# Patient Record
Sex: Female | Born: 1970
Health system: Southern US, Community
[De-identification: ages and names within clinical notes are randomized; demographics above are authoritative.]

## PROBLEM LIST (undated history)

## (undated) ENCOUNTER — Emergency Department
Disposition: A | Discharge status: Home / Self Care | Payer: Self-pay | Attending: Emergency Medicine | Admitting: Emergency Medicine

## (undated) ENCOUNTER — Emergency Department (HOSPITAL_BASED_OUTPATIENT_CLINIC_OR_DEPARTMENT_OTHER): Payer: BC Managed Care – PPO | Source: Home / Self Care

## (undated) DIAGNOSIS — R51 Headache: Secondary | ICD-10-CM

## (undated) DIAGNOSIS — R519 Headache, unspecified: Secondary | ICD-10-CM

## (undated) DIAGNOSIS — Z8619 Personal history of other infectious and parasitic diseases: Secondary | ICD-10-CM

## (undated) DIAGNOSIS — D242 Benign neoplasm of left breast: Secondary | ICD-10-CM

## (undated) HISTORY — DX: Personal history of other infectious and parasitic diseases: Z86.19

## (undated) HISTORY — PX: DILATION AND CURETTAGE OF UTERUS: SHX78

## (undated) HISTORY — PX: ROTATOR CUFF REPAIR: SHX139

---

## 2002-04-06 ENCOUNTER — Other Ambulatory Visit: Admission: RE | Admit: 2002-04-06 | Discharge: 2002-04-06 | Payer: Self-pay | Admitting: Gynecology

## 2002-11-11 ENCOUNTER — Inpatient Hospital Stay (HOSPITAL_COMMUNITY): Admission: AD | Admit: 2002-11-11 | Discharge: 2002-11-14 | Payer: Self-pay | Admitting: Gynecology

## 2002-12-21 ENCOUNTER — Other Ambulatory Visit: Admission: RE | Admit: 2002-12-21 | Discharge: 2002-12-21 | Payer: Self-pay | Admitting: Gynecology

## 2004-01-10 ENCOUNTER — Other Ambulatory Visit: Admission: RE | Admit: 2004-01-10 | Discharge: 2004-01-10 | Payer: Self-pay | Admitting: Gynecology

## 2004-02-08 ENCOUNTER — Ambulatory Visit (HOSPITAL_COMMUNITY): Admission: RE | Admit: 2004-02-08 | Discharge: 2004-02-08 | Payer: Self-pay | Admitting: Gynecology

## 2004-03-10 ENCOUNTER — Ambulatory Visit: Payer: Self-pay | Admitting: Family Medicine

## 2004-12-28 ENCOUNTER — Other Ambulatory Visit: Admission: RE | Admit: 2004-12-28 | Discharge: 2004-12-28 | Payer: Self-pay | Admitting: Gynecology

## 2005-01-25 ENCOUNTER — Ambulatory Visit (HOSPITAL_COMMUNITY): Admission: RE | Admit: 2005-01-25 | Discharge: 2005-01-25 | Payer: Self-pay | Admitting: Gynecology

## 2005-01-25 ENCOUNTER — Encounter (INDEPENDENT_AMBULATORY_CARE_PROVIDER_SITE_OTHER): Payer: Self-pay | Admitting: *Deleted

## 2005-04-13 ENCOUNTER — Ambulatory Visit (HOSPITAL_COMMUNITY): Admission: RE | Admit: 2005-04-13 | Discharge: 2005-04-13 | Payer: Self-pay | Admitting: Gynecology

## 2006-04-24 ENCOUNTER — Ambulatory Visit (HOSPITAL_COMMUNITY): Admission: RE | Admit: 2006-04-24 | Discharge: 2006-04-24 | Payer: Self-pay | Admitting: Obstetrics & Gynecology

## 2007-04-28 ENCOUNTER — Ambulatory Visit (HOSPITAL_COMMUNITY): Admission: RE | Admit: 2007-04-28 | Discharge: 2007-04-28 | Payer: Self-pay | Admitting: Obstetrics & Gynecology

## 2008-04-29 ENCOUNTER — Ambulatory Visit (HOSPITAL_COMMUNITY): Admission: RE | Admit: 2008-04-29 | Discharge: 2008-04-29 | Payer: Self-pay | Admitting: Obstetrics & Gynecology

## 2009-05-02 ENCOUNTER — Ambulatory Visit (HOSPITAL_COMMUNITY): Admission: RE | Admit: 2009-05-02 | Discharge: 2009-05-02 | Payer: Self-pay | Admitting: Obstetrics & Gynecology

## 2010-05-04 ENCOUNTER — Ambulatory Visit (HOSPITAL_COMMUNITY)
Admission: RE | Admit: 2010-05-04 | Discharge: 2010-05-04 | Payer: Self-pay | Source: Home / Self Care | Attending: Obstetrics & Gynecology | Admitting: Obstetrics & Gynecology

## 2010-08-25 NOTE — H&P (Signed)
   NAMELAVETTE, Tanya Warren                             ACCOUNT NO.:  [id number]   MEDICAL RECORD NO.:  [id number]                   PATIENT TYPE:  MAT   LOCATION:  MATC                                 FACILITY:  [facility]   PHYSICIAN:  Ivor Costa. Farrel Gobble, M.D.              DATE OF BIRTH:  1970-12-10   DATE OF ADMISSION:  11/11/2002  DATE OF DISCHARGE:                                HISTORY & PHYSICAL   CHIEF COMPLAINT:  40-1/2 weeks, favorable cervix.   HISTORY OF PRESENT ILLNESS:  The patient is a 41 year old G2, P1 with an  estimated   Dictation cut off at this point                                               [employer]. Farrel Gobble, M.D.    THL/MEDQ  D:  11/11/2002  T:  11/11/2002  Job:  [id number]

## 2010-08-25 NOTE — Op Note (Signed)
Tanya Warren, Tanya Warren                   ACCOUNT NO.:  [id number]   MEDICAL RECORD NO.:  [id number]          PATIENT TYPE:  AMB   LOCATION:  SDC                           FACILITY:  [facility]   PHYSICIAN:  Ivor Costa. Farrel Gobble, M.D. DATE OF BIRTH:  03/04/1971   DATE OF PROCEDURE:  01/25/2005  DATE OF DISCHARGE:                                 OPERATIVE REPORT   PREOPERATIVE DIAGNOSIS:  Missed abortion.   POSTOPERATIVE DIAGNOSIS:  Missed abortion.   PROCEDURE:  First trimester dilatation and curettage.   SURGEON:  Ivor Costa. Farrel Gobble, M.D.   ANESTHESIA:  IV sedation with a paracervical block.   ESTIMATED BLOOD LOSS:  Minimal.   FINDINGS:  The uterus was retroverted, sounding 9 cm.  The cervix was  grossly dilated.   PROCEDURE:  The patient was taken to the operating room and IV sedation was  induced, placed in the dorsal lithotomy position, prepped and draped in the  usual sterile fashion.  Bimanual exam was performed.  The orientation of the  uterus was confirmed.  A bivalve speculum was then placed in the vagina.  The cervix was visualized, stabilized with a single-tooth tenaculum.  The  cervix appeared to be grossly dilated.  A paracervical block of equal parts  of 2% lidocaine with epinephrine and 0.5% Marcaine for a total of 20 mL was  placed circumferentially around the cervix.  The patient was noted to be  comfortable.  The uterine sound was placed for orientation of the canal.  The cervix was noted to be grossly dilated.  A 23 French dilator was  advanced through the cervix without any difficulty, as was a 25.  Based on  that, a #7 suction curette was then advanced through the cervix.  Suction  was obtained.  The cavity was cleared of its contents.  Sharp curettage  followed again by suction was then performed until the cavity was felt to be  empty.  The uterine walls felt smooth and there was a cry noted throughout.  The patient tolerated the procedure well.  Sponge, lap and needle  counts  were correct x2.  She was given Methergine intraoperatively.      Ivor Costa. Farrel Gobble, M.D.  Electronically Signed     THL/MEDQ  D:  01/25/2005  T:  01/25/2005  Job:  [id number]

## 2010-08-25 NOTE — Discharge Summary (Signed)
   Tanya Warren, Tanya Warren                             ACCOUNT NO.:  [id number]   MEDICAL RECORD NO.:  [id number]                   PATIENT TYPE:  INP   LOCATION:  9134                                 FACILITY:  [facility]   PHYSICIAN:  Juan H. Lily Peer, M.D.             DATE OF BIRTH:  Feb 02, 1971   DATE OF ADMISSION:  11/11/2002  DATE OF DISCHARGE:  11/14/2002                                 DISCHARGE SUMMARY   DISCHARGE DIAGNOSES:  1. Intrauterine pregnancy 40+ weeks, delivered.  2. Positive group B Strep.  3. Status post spontaneous vaginal delivery.   HISTORY:  A 40 year old female gravida 2, para 0, aborta 1 with an EDC of  November 10, 2002.  Prenatal course has been complicated only by positive group  B Strep.   HOSPITAL COURSE:  On November 11, 2002 patient presented at 40+ weeks for  decreased fetal movement.  She had initially been scheduled for induction on  the following day but on admission she was found to actually be contracting  every two to three minutes with a reassuring fetal heart rate tracing.  The  patient was begun on clindamycin for protocol.  Cervix was 1, 50%, -1.  Labor was augmented.  Subsequently, on November 12, 2002 patient underwent a  spontaneous vaginal delivery of a female, Apgars 8/9, weight 7 pounds 12  ounces.  Noted to be a midline episiotomy.  There was dark, thin meconium.  DeLee suctioned prior to delivery.  Pediatrics in attendance.  There were no  complications.  Postpartum patient remained afebrile, voiding, stable  condition.  She was discharged to home on November 14, 2002 and given  [facility] Gynecology postpartum instructions/postpartum booklet.   ACCESSORY CLINICAL FINDINGS:  Laboratories:  The patient is O+.  Rubella  immune.  On November 13, 2002 hemoglobin 11.3.   DISPOSITION:  The patient is discharged to home.  Informed to return to  office six weeks.  If any problem prior to that time to be seen in office.     Susa Loffler, P.A.                     Juan H. Lily Peer, M.D.    TSG/MEDQ  D:  12/04/2002  T:  12/04/2002  Job:  [id number]

## 2011-02-16 ENCOUNTER — Emergency Department
Admission: EM | Admit: 2011-02-16 | Discharge: 2011-02-16 | Disposition: A | Discharge status: Home / Self Care | Payer: BC Managed Care – PPO | Source: Home / Self Care | Attending: Family Medicine | Admitting: Family Medicine

## 2011-02-16 ENCOUNTER — Encounter: Payer: Self-pay | Admitting: *Deleted

## 2011-02-16 DIAGNOSIS — J209 Acute bronchitis, unspecified: Secondary | ICD-10-CM

## 2011-02-16 MED ORDER — BENZONATATE 200 MG PO CAPS: 200.0000 mg | ORAL_CAPSULE | Freq: Every day | ORAL | Status: AC

## 2011-02-16 MED ORDER — CLARITHROMYCIN 500 MG PO TABS: 500.0000 mg | ORAL_TABLET | Freq: Two times a day (BID) | ORAL | Status: AC

## 2011-02-16 NOTE — ED Notes (Signed)
Patient c/o dry cough that lasted 2 weeks. Cough resolved for a week and has returned 3 days ago.

## 2011-02-16 NOTE — ED Provider Notes (Signed)
History     CSN: [id number] Arrival date & time: 02/16/2011 10:49 AM   First MD Initiated Contact with Patient 02/16/11 1106      Chief Complaint  Patient presents with  . Cough      HPI Comments: Patient notes that she developed a cold-like illness about 4 weeks ago that mostly resolved after two weeks, but mild cough persisted.  She has a history of seasonal allergies in autumn.  The cough is now becoming worse. She notes that she had a Tdap in August, 2012  Patient is a 40 y.o. female presenting with cough. The history is provided by the patient.  Cough This is a recurrent problem. The current episode started more than 1 week ago. The problem occurs hourly. The problem has been gradually worsening. The cough is non-productive. There has been no fever. Pertinent negatives include no chest pain, no chills, no sweats, no weight loss, no ear congestion, no ear pain, no headaches, no rhinorrhea, no sore throat, no myalgias, no shortness of breath and no wheezing. She has tried nothing for the symptoms. She is not a smoker. Her past medical history is significant for pneumonia. Past medical history comments: History of pneumonia about 15 years ago.    History reviewed. No pertinent past medical history.  History reviewed. No pertinent past surgical history.  History reviewed. No pertinent family history.  History  Substance Use Topics  . Smoking status: Never Smoker   . Smokeless tobacco: Not on file  . Alcohol Use: Yes    OB History    Grav Para Term Preterm Abortions TAB SAB Ect Mult Living                  Review of Systems  Constitutional: Negative for chills and weight loss.  HENT: Negative for ear pain, sore throat and rhinorrhea.   Eyes: Negative.   Respiratory: Positive for cough and chest tightness. Negative for shortness of breath and wheezing.   Cardiovascular: Negative.  Negative for chest pain.  Gastrointestinal: Negative.   Genitourinary: Negative.     Musculoskeletal: Negative.  Negative for myalgias.  Neurological: Negative for headaches.    Allergies  Cinnamon; Neosporin lt; and Penicillins  Home Medications   Current Outpatient Rx  Name Route Sig Dispense Refill  . BENZONATATE 200 MG PO CAPS Oral Take 1 capsule (200 mg total) by mouth at bedtime. 12 capsule 0  . CLARITHROMYCIN 500 MG PO TABS Oral Take 1 tablet (500 mg total) by mouth 2 (two) times daily. 14 tablet 0    BP 148/98  Pulse 92  Temp(Src) 98.3 F (36.8 C) (Oral)  Resp 16  Ht 5\' 8"  (1.727 m)  Wt 150 lb (68.04 kg)  BMI 22.81 kg/m2  SpO2 100%  Physical Exam  Nursing note and vitals reviewed. Constitutional: She is oriented to person, place, and time. She appears well-developed and well-nourished. No distress.  HENT:  Head: Normocephalic and atraumatic.  Right Ear: Tympanic membrane normal.  Left Ear: Tympanic membrane normal.  Nose: Nose normal.  Mouth/Throat: Oropharynx is clear and moist. No oropharyngeal exudate.  Eyes: Right eye exhibits no discharge. Left eye exhibits no discharge. No scleral icterus.  Neck: Neck supple.  Cardiovascular: Normal rate, regular rhythm and normal heart sounds.   Pulmonary/Chest: No respiratory distress. She has no wheezes. She has no rhonchi. She has no rales.  Lymphadenopathy:    She has no cervical adenopathy.  Neurological: She is alert and oriented to person, place, and  time.  Skin: Skin is warm and dry.    ED Course  Procedures:  None      1. Acute bronchitis       MDM  Begin Biaxin.  Begin expectorant, topical decongestant if needed, saline nasal spray, cough suppressant at bedtime.  Increase fluid intake, rest.  Follow-up with PCP if not improving. Follow-up with PCP if BP remains elevated         Donna Christen, MD 02/16/11 2012

## 2011-02-18 ENCOUNTER — Telehealth: Payer: Self-pay | Admitting: Emergency Medicine

## 2011-02-18 MED ORDER — HYDROCOD POLST-CHLORPHEN POLST 10-8 MG/5ML PO LQCR
5.0000 mL | Freq: Every evening | ORAL | Status: AC
Start: 2011-02-18 — End: 2011-02-18

## 2011-04-12 ENCOUNTER — Other Ambulatory Visit (HOSPITAL_COMMUNITY): Payer: Self-pay | Admitting: Obstetrics & Gynecology

## 2011-04-12 DIAGNOSIS — Z1231 Encounter for screening mammogram for malignant neoplasm of breast: Secondary | ICD-10-CM

## 2011-06-05 ENCOUNTER — Ambulatory Visit (HOSPITAL_COMMUNITY): Payer: BC Managed Care – PPO

## 2011-06-22 ENCOUNTER — Ambulatory Visit (HOSPITAL_COMMUNITY)
Admission: RE | Admit: 2011-06-22 | Discharge: 2011-06-22 | Disposition: A | Discharge status: Home / Self Care | Payer: BC Managed Care – PPO | Source: Ambulatory Visit | Attending: Obstetrics & Gynecology | Admitting: Obstetrics & Gynecology

## 2011-06-22 DIAGNOSIS — Z1231 Encounter for screening mammogram for malignant neoplasm of breast: Secondary | ICD-10-CM | POA: Insufficient documentation

## 2011-10-09 ENCOUNTER — Encounter (HOSPITAL_BASED_OUTPATIENT_CLINIC_OR_DEPARTMENT_OTHER): Payer: Self-pay

## 2011-10-09 ENCOUNTER — Emergency Department (HOSPITAL_BASED_OUTPATIENT_CLINIC_OR_DEPARTMENT_OTHER)
Admission: EM | Admit: 2011-10-09 | Discharge: 2011-10-09 | Disposition: A | Discharge status: Home / Self Care | Payer: Worker's Compensation | Attending: Emergency Medicine | Admitting: Emergency Medicine

## 2011-10-09 DIAGNOSIS — Y939 Activity, unspecified: Secondary | ICD-10-CM | POA: Insufficient documentation

## 2011-10-09 DIAGNOSIS — Y999 Unspecified external cause status: Secondary | ICD-10-CM | POA: Insufficient documentation

## 2011-10-09 DIAGNOSIS — S8010XA Contusion of unspecified lower leg, initial encounter: Secondary | ICD-10-CM | POA: Insufficient documentation

## 2011-10-09 DIAGNOSIS — W540XXA Bitten by dog, initial encounter: Secondary | ICD-10-CM | POA: Insufficient documentation

## 2011-10-09 DIAGNOSIS — Z203 Contact with and (suspected) exposure to rabies: Secondary | ICD-10-CM | POA: Insufficient documentation

## 2011-10-09 DIAGNOSIS — S81859A Open bite, unspecified lower leg, initial encounter: Secondary | ICD-10-CM

## 2011-10-09 DIAGNOSIS — Y929 Unspecified place or not applicable: Secondary | ICD-10-CM | POA: Insufficient documentation

## 2011-10-09 MED ORDER — RABIES VACCINE, PCEC IM SUSR
INTRAMUSCULAR | Status: AC
  Administered 2011-10-09: 1 mL via INTRAMUSCULAR
  Filled 2011-10-09: qty 1

## 2011-10-09 MED ORDER — RABIES VACCINE, PCEC IM SUSR
1.0000 mL | Freq: Once | INTRAMUSCULAR | Status: AC
  Administered 2011-10-09: 1 mL via INTRAMUSCULAR

## 2011-10-09 MED ORDER — RABIES IMMUNE GLOBULIN 150 UNIT/ML IM INJ
20.0000 [IU]/kg | INJECTION | Freq: Once | INTRAMUSCULAR | Status: AC
  Administered 2011-10-09: 1425 [IU] via INTRAMUSCULAR
  Filled 2011-10-09: qty 9.5

## 2011-10-09 MED ORDER — RABIES IMMUNE GLOBULIN 150 UNIT/ML IM INJ
INJECTION | INTRAMUSCULAR | Status: AC
  Administered 2011-10-09: 1425 [IU] via INTRAMUSCULAR
  Filled 2011-10-09: qty 10

## 2011-10-09 NOTE — ED Notes (Signed)
Rabies schedule completed and faxed to RN @ [facility] for pt, also faxed to [facility] and pharmacy x 2.

## 2011-10-09 NOTE — ED Notes (Signed)
Dog bite to left LE 6/26-dog rabies status is unclear-pt seen by med "[facility]" x 2-on abx at this time-seen at [facility] today

## 2011-10-09 NOTE — ED Provider Notes (Signed)
History     CSN: [id number]  Arrival date & time 10/09/11  1457   First MD Initiated Contact with Patient 10/09/11 1606      Chief Complaint  Patient presents with  . Animal Bite    (Consider location/radiation/quality/duration/timing/severity/associated sxs/prior treatment) Patient is a 41 y.o. female presenting with animal bite. The history is provided by the patient. No language interpreter was used.  Animal Bite  Episode onset: 6/26. The incident occurred in the street. There is an injury to the left lower leg. The patient is experiencing no pain.  Pt was bitten by a dog.  Pt here for rabies shots.  History reviewed. No pertinent past medical history.  Past Surgical History  Procedure Date  . Dilation and curettage of uterus     No family history on file.  History  Substance Use Topics  . Smoking status: Never Smoker   . Smokeless tobacco: Not on file  . Alcohol Use: Yes    OB History    Grav Para Term Preterm Abortions TAB SAB Ect Mult Living                  Review of Systems  Musculoskeletal: Positive for joint swelling.  Skin: Positive for wound.  All other systems reviewed and are negative.    Allergies  Allantoin-pramoxine; Cinnamon; Penicillins; and Neosporin  Home Medications   Current Outpatient Rx  Name Route Sig Dispense Refill  . IBUPROFEN 600 MG PO TABS Oral Take 600 mg by mouth every 6 (six) hours as needed. Patient used this medication for pain.    Marland Kitchen METRONIDAZOLE 500 MG PO TABS Oral Take 500 mg by mouth 3 (three) times daily.    . SULFAMETHOXAZOLE-TMP DS 800-160 MG PO TABS Oral Take 1 tablet by mouth every 12 (twelve) hours.      BP 144/92  Pulse 92  Temp 98.6 F (37 C) (Oral)  Resp 16  Ht 5\' 8"  (1.727 m)  Wt 158 lb (71.668 kg)  BMI 24.02 kg/m2  SpO2 100%  LMP 09/11/2011  Physical Exam  Nursing note and vitals reviewed. Constitutional: She is oriented to person, place, and time. She appears well-developed and well-nourished.   HENT:  Head: Normocephalic.  Musculoskeletal: She exhibits tenderness.  Neurological: She is alert and oriented to person, place, and time. She has normal reflexes.  Skin: There is erythema.  Psychiatric: She has a normal mood and affect.       Bruised area, abrasions left lower leg,      ED Course  Procedures (including critical care time)  Labs Reviewed - No data to display No results found.   1. Dog Bite Of Lower Leg       MDM  Pt given rabies vaccine and rabies immune globulin.   Pt advised of follow up        Elson Areas, [place] 10/09/11 1641  Lonia Skinner [place], [place] 10/09/11 1642

## 2011-10-12 ENCOUNTER — Emergency Department (INDEPENDENT_AMBULATORY_CARE_PROVIDER_SITE_OTHER)
Admission: EM | Admit: 2011-10-12 | Discharge: 2011-10-12 | Disposition: A | Discharge status: Home / Self Care | Payer: Worker's Compensation | Source: Home / Self Care

## 2011-10-12 ENCOUNTER — Encounter: Payer: Self-pay | Admitting: *Deleted

## 2011-10-12 DIAGNOSIS — Z23 Encounter for immunization: Secondary | ICD-10-CM

## 2011-10-12 MED ORDER — RABIES VACCINE, PCEC IM SUSR
1.0000 mL | Freq: Once | INTRAMUSCULAR | Status: AC
  Administered 2011-10-12: 1 mL via INTRAMUSCULAR

## 2011-10-12 NOTE — ED Notes (Signed)
Pt came in for second rabies shot

## 2011-10-13 NOTE — ED Provider Notes (Signed)
Medical screening examination/treatment/procedure(s) were performed by non-physician practitioner and as supervising physician I was immediately available for consultation/collaboration.  [place] Jaquith M [place] Dodgen, MD 10/13/11 1254 

## 2012-06-04 ENCOUNTER — Other Ambulatory Visit (HOSPITAL_COMMUNITY): Payer: Self-pay | Admitting: Obstetrics & Gynecology

## 2012-06-04 DIAGNOSIS — Z1231 Encounter for screening mammogram for malignant neoplasm of breast: Secondary | ICD-10-CM

## 2012-06-23 ENCOUNTER — Ambulatory Visit (HOSPITAL_COMMUNITY)
Admission: RE | Admit: 2012-06-23 | Discharge: 2012-06-23 | Disposition: A | Discharge status: Home / Self Care | Payer: BC Managed Care – PPO | Source: Ambulatory Visit | Attending: Obstetrics & Gynecology | Admitting: Obstetrics & Gynecology

## 2012-06-23 DIAGNOSIS — Z1231 Encounter for screening mammogram for malignant neoplasm of breast: Secondary | ICD-10-CM | POA: Insufficient documentation

## 2012-09-02 ENCOUNTER — Encounter: Payer: Self-pay | Admitting: *Deleted

## 2012-09-02 ENCOUNTER — Emergency Department
Admission: EM | Admit: 2012-09-02 | Discharge: 2012-09-02 | Disposition: A | Discharge status: Home / Self Care | Payer: BC Managed Care – PPO | Source: Home / Self Care | Attending: Family Medicine | Admitting: Family Medicine

## 2012-09-02 DIAGNOSIS — J020 Streptococcal pharyngitis: Secondary | ICD-10-CM

## 2012-09-02 DIAGNOSIS — J029 Acute pharyngitis, unspecified: Secondary | ICD-10-CM

## 2012-09-02 LAB — POCT RAPID STREP A (OFFICE): Rapid Strep A Screen: POSITIVE — AB

## 2012-09-02 MED ORDER — AZITHROMYCIN 250 MG PO TABS: ORAL_TABLET | ORAL | Status: DC

## 2012-09-02 NOTE — ED Notes (Signed)
Pt c/o sore throat, chills and body aches x 1 day. Denies fever. She has taken Advil.

## 2012-09-02 NOTE — ED Provider Notes (Signed)
History     CSN: [id number]  Arrival date & time 09/02/12  1308   First MD Initiated Contact with Patient 09/02/12 [phone]      Chief Complaint  Patient presents with  . Sore Throat  . Generalized Body Aches   HPI  SORE THROAT  Onset: 1 day  Description: sore throat, fever  Modifying factors: none  Symptoms  Fever:  yes URI symptoms: no Cough: no Headache: no Rash:  no Swollen glands:   yes Recent Strep Exposure: unsure  LUQ pain: no Heartburn/brash: no Allergy Symptoms: no  Red Flags STD exposure: no Breathing difficulty: no Drooling: no Trismus: no   History reviewed. No pertinent past medical history.  Past Surgical History  Procedure Laterality Date  . Dilation and curettage of uterus      History reviewed. No pertinent family history.  History  Substance Use Topics  . Smoking status: Never Smoker   . Smokeless tobacco: Not on file  . Alcohol Use: Yes    OB History   Grav Para Term Preterm Abortions TAB SAB Ect Mult Living                  Review of Systems  All other systems reviewed and are negative.    Allergies  Allantoin-pramoxine; Cinnamon; Penicillins; and Neosporin  Home Medications   Current Outpatient Rx  Name  Route  Sig  Dispense  Refill  . ibuprofen (ADVIL,MOTRIN) 600 MG tablet   Oral   Take 600 mg by mouth every 6 (six) hours as needed. Patient used this medication for pain.         . metroNIDAZOLE (FLAGYL) 500 MG tablet   Oral   Take 500 mg by mouth 3 (three) times daily.         Marland Kitchen sulfamethoxazole-trimethoprim (BACTRIM DS) 800-160 MG per tablet   Oral   Take 1 tablet by mouth every 12 (twelve) hours.           There were no vitals taken for this visit.  Physical Exam  Constitutional: She appears well-developed and well-nourished.  HENT:  Head: Normocephalic and atraumatic.  Right Ear: External ear normal.  Left Ear: External ear normal.  Mouth/Throat: Oropharyngeal exudate present.  Eyes:  Conjunctivae are normal. Pupils are equal, round, and reactive to light.  Neck: Normal range of motion. Neck supple.  Cardiovascular: Normal rate, regular rhythm and normal heart sounds.   Pulmonary/Chest: Effort normal.  Abdominal: Soft.  Musculoskeletal: Normal range of motion.  Lymphadenopathy:    She has cervical adenopathy.  Neurological: She is alert.  Skin: Skin is warm.    ED Course  Procedures (including critical care time)  Labs Reviewed  POCT RAPID STREP A (OFFICE)   No results found.   1. Strep pharyngitis       MDM  Rapid strep positive Will treat with zpak (PCN allergy) Discussed general care and ENT red flags.  Follow up as needed.     The patient and/or caregiver has been counseled thoroughly with regard to treatment plan and/or medications prescribed including dosage, schedule, interactions, rationale for use, and possible side effects and they verbalize understanding. Diagnoses and expected course of recovery discussed and will return if not improved as expected or if the condition worsens. Patient and/or caregiver verbalized understanding.             Doree Albee, MD 09/02/12 [phone]

## 2013-02-27 DIAGNOSIS — R002 Palpitations: Secondary | ICD-10-CM | POA: Insufficient documentation

## 2013-06-17 ENCOUNTER — Other Ambulatory Visit (HOSPITAL_COMMUNITY): Payer: Self-pay | Admitting: Obstetrics & Gynecology

## 2013-06-17 DIAGNOSIS — Z1231 Encounter for screening mammogram for malignant neoplasm of breast: Secondary | ICD-10-CM

## 2013-06-30 ENCOUNTER — Ambulatory Visit (HOSPITAL_COMMUNITY)
Admission: RE | Admit: 2013-06-30 | Discharge: 2013-06-30 | Disposition: A | Discharge status: Home / Self Care | Payer: 59 | Source: Ambulatory Visit | Attending: Obstetrics & Gynecology | Admitting: Obstetrics & Gynecology

## 2013-06-30 DIAGNOSIS — Z1231 Encounter for screening mammogram for malignant neoplasm of breast: Secondary | ICD-10-CM

## 2014-06-07 ENCOUNTER — Other Ambulatory Visit (HOSPITAL_COMMUNITY): Payer: Self-pay | Admitting: Nurse Practitioner

## 2014-06-07 DIAGNOSIS — Z1231 Encounter for screening mammogram for malignant neoplasm of breast: Secondary | ICD-10-CM

## 2014-07-02 ENCOUNTER — Ambulatory Visit (HOSPITAL_COMMUNITY)
Admission: RE | Admit: 2014-07-02 | Discharge: 2014-07-02 | Disposition: A | Discharge status: Home / Self Care | Payer: 59 | Source: Ambulatory Visit | Attending: Nurse Practitioner | Admitting: Nurse Practitioner

## 2014-07-02 DIAGNOSIS — Z1231 Encounter for screening mammogram for malignant neoplasm of breast: Secondary | ICD-10-CM | POA: Diagnosis present

## 2015-05-23 ENCOUNTER — Encounter (HOSPITAL_COMMUNITY): Payer: Self-pay

## 2015-05-23 ENCOUNTER — Emergency Department (HOSPITAL_COMMUNITY)
Admission: EM | Admit: 2015-05-23 | Discharge: 2015-05-23 | Disposition: A | Discharge status: Home / Self Care | Payer: 59 | Attending: Emergency Medicine | Admitting: Emergency Medicine

## 2015-05-23 ENCOUNTER — Emergency Department (HOSPITAL_COMMUNITY): Payer: 59

## 2015-05-23 DIAGNOSIS — R52 Pain, unspecified: Secondary | ICD-10-CM

## 2015-05-23 DIAGNOSIS — Z3202 Encounter for pregnancy test, result negative: Secondary | ICD-10-CM | POA: Diagnosis not present

## 2015-05-23 DIAGNOSIS — R1031 Right lower quadrant pain: Secondary | ICD-10-CM | POA: Insufficient documentation

## 2015-05-23 DIAGNOSIS — R110 Nausea: Secondary | ICD-10-CM | POA: Insufficient documentation

## 2015-05-23 DIAGNOSIS — Z880 Allergy status to penicillin: Secondary | ICD-10-CM | POA: Diagnosis not present

## 2015-05-23 LAB — URINE MICROSCOPIC-ADD ON

## 2015-05-23 LAB — CBC
HEMATOCRIT: 45.2 % (ref 36.0–46.0)
HEMOGLOBIN: 14.7 g/dL (ref 12.0–15.0)
MCH: 30.1 pg (ref 26.0–34.0)
MCHC: 32.5 g/dL (ref 30.0–36.0)
MCV: 92.6 fL (ref 78.0–100.0)
Platelets: 203 10*3/uL (ref 150–400)
RBC: 4.88 MIL/uL (ref 3.87–5.11)
RDW: 12.7 % (ref 11.5–15.5)
WBC: 6.3 10*3/uL (ref 4.0–10.5)

## 2015-05-23 LAB — COMPREHENSIVE METABOLIC PANEL
ALBUMIN: 4.7 g/dL (ref 3.5–5.0)
ALT: 23 U/L (ref 14–54)
ANION GAP: 12 (ref 5–15)
AST: 23 U/L (ref 15–41)
Alkaline Phosphatase: 58 U/L (ref 38–126)
BILIRUBIN TOTAL: 1.1 mg/dL (ref 0.3–1.2)
BUN: 11 mg/dL (ref 6–20)
CO2: 25 mmol/L (ref 22–32)
Calcium: 9.3 mg/dL (ref 8.9–10.3)
Chloride: 103 mmol/L (ref 101–111)
Creatinine, Ser: 0.92 mg/dL (ref 0.44–1.00)
GLUCOSE: 117 mg/dL — AB (ref 65–99)
POTASSIUM: 4.5 mmol/L (ref 3.5–5.1)
Sodium: 140 mmol/L (ref 135–145)
TOTAL PROTEIN: 8 g/dL (ref 6.5–8.1)

## 2015-05-23 LAB — I-STAT BETA HCG BLOOD, ED (MC, WL, AP ONLY)

## 2015-05-23 LAB — URINALYSIS, ROUTINE W REFLEX MICROSCOPIC
Bilirubin Urine: NEGATIVE
Glucose, UA: NEGATIVE mg/dL
Ketones, ur: NEGATIVE mg/dL
NITRITE: NEGATIVE
PH: 6.5 (ref 5.0–8.0)
Protein, ur: NEGATIVE mg/dL
SPECIFIC GRAVITY, URINE: 1.007 (ref 1.005–1.030)

## 2015-05-23 LAB — LIPASE, BLOOD: Lipase: 21 U/L (ref 11–51)

## 2015-05-23 MED ORDER — ONDANSETRON HCL 4 MG/2ML IJ SOLN
4.0000 mg | Freq: Once | INTRAMUSCULAR | Status: AC
  Administered 2015-05-23: 4 mg via INTRAVENOUS
  Filled 2015-05-23: qty 2

## 2015-05-23 MED ORDER — IOHEXOL 300 MG/ML  SOLN
100.0000 mL | Freq: Once | INTRAMUSCULAR | Status: AC | PRN
  Administered 2015-05-23: 100 mL via INTRAVENOUS

## 2015-05-23 MED ORDER — HYDROCODONE-ACETAMINOPHEN 5-325 MG PO TABS: 1.0000 | ORAL_TABLET | Freq: Once | ORAL | Status: DC

## 2015-05-23 MED ORDER — MORPHINE SULFATE (PF) 4 MG/ML IV SOLN
4.0000 mg | Freq: Once | INTRAVENOUS | Status: AC
  Administered 2015-05-23: 4 mg via INTRAVENOUS
  Filled 2015-05-23: qty 1

## 2015-05-23 MED ORDER — SODIUM CHLORIDE 0.9 % IV BOLUS (SEPSIS)
1000.0000 mL | Freq: Once | INTRAVENOUS | Status: AC
  Administered 2015-05-23: 1000 mL via INTRAVENOUS

## 2015-05-23 MED ORDER — HYDROCODONE-ACETAMINOPHEN 5-325 MG PO TABS
1.0000 | ORAL_TABLET | ORAL | Status: DC | PRN
Start: 2015-05-23 — End: 2016-08-23

## 2015-05-23 NOTE — ED Notes (Signed)
Patient transported to Ultrasound 

## 2015-05-23 NOTE — ED Provider Notes (Signed)
CSN: [id number]     Arrival date & time 05/23/15  0802 History   First MD Initiated Contact with Patient 05/23/15 0840     Chief Complaint  Patient presents with  . Abdominal Pain  . Nausea     (Consider location/radiation/quality/duration/timing/severity/associated sxs/prior Treatment) HPI Comments: 45 year old female who presents with right lower quadrant abdominal pain. Patient states that overnight she began having right lower quadrant abdominal pain that is constant but intermittently becomes severe. She has had associated nausea but no vomiting. She reports decreased by mouth intake progressively throughout the day yesterday. She had 3 normal bowel movements this morning, no blood. No urinary symptoms. She recently had a menstrual period that was heavier than normal but denies any vaginal discharge. No fevers.  Patient is a 45 y.o. female presenting with abdominal pain. The history is provided by the patient.  Abdominal Pain   History reviewed. No pertinent past medical history. Past Surgical History  Procedure Laterality Date  . Dilation and curettage of uterus     History reviewed. No pertinent family history. Social History  Substance Use Topics  . Smoking status: Never Smoker   . Smokeless tobacco: None  . Alcohol Use: Yes     Comment: occ   OB History    No data available     Review of Systems  Gastrointestinal: Positive for abdominal pain.    10 Systems reviewed and are negative for acute change except as noted in the HPI.   Allergies  Cinnamon; Penicillins; Allantoin-pramoxine; and Neosporin  Home Medications   Prior to Admission medications   Medication Sig Start Date End Date Taking? Authorizing Provider  ibuprofen (ADVIL,MOTRIN) 200 MG tablet Take 600-800 mg by mouth every 6 (six) hours as needed for headache or moderate pain.   Yes Historical Provider, MD  HYDROcodone-acetaminophen (NORCO/VICODIN) 5-325 MG tablet Take 1 tablet by mouth every 4 (four)  hours as needed. 05/23/15   Wenda Overland Little, MD   BP 159/86 mmHg  Pulse 86  Temp(Src) 98.1 F (36.7 C) (Oral)  Resp 16  SpO2 100%  LMP 05/20/2015 Physical Exam  Constitutional: She is oriented to person, place, and time. She appears well-developed and well-nourished. No distress.  uncomfortable  HENT:  Head: Normocephalic and atraumatic.  Moist mucous membranes  Eyes: Conjunctivae are normal. Pupils are equal, round, and reactive to light.  Neck: Neck supple.  Cardiovascular: Normal rate, regular rhythm and normal heart sounds.   No murmur heard. Pulmonary/Chest: Effort normal and breath sounds normal.  Abdominal: Soft. Bowel sounds are normal. She exhibits no distension. There is tenderness. There is rebound. There is no guarding.  RLQ TTP  Musculoskeletal: She exhibits no edema.  Neurological: She is alert and oriented to person, place, and time.  Fluent speech  Skin: Skin is warm and dry.  Psychiatric: She has a normal mood and affect. Judgment normal.  Nursing note and vitals reviewed.   ED Course  Procedures (including critical care time) Labs Review Labs Reviewed  COMPREHENSIVE METABOLIC PANEL - Abnormal; Notable for the following:    Glucose, Bld 117 (*)    All other components within normal limits  URINALYSIS, ROUTINE W REFLEX MICROSCOPIC (NOT AT [facility]) - Abnormal; Notable for the following:    APPearance CLOUDY (*)    Hgb urine dipstick MODERATE (*)    Leukocytes, UA SMALL (*)    All other components within normal limits  URINE MICROSCOPIC-ADD ON - Abnormal; Notable for the following:    Squamous Epithelial /  LPF 0-5 (*)    Bacteria, UA RARE (*)    All other components within normal limits  LIPASE, BLOOD  CBC  I-STAT BETA HCG BLOOD, ED ([facility], [facility], [facility] ONLY)    Imaging Review US Transvaginal Non-ob  05/23/2015  CLINICAL DATA:  Right lower quadrant pain. EXAM: TRANSABDOMINAL AND TRANSVAGINAL ULTRASOUND OF PELVIS DOPPLER ULTRASOUND OF OVARIES TECHNIQUE: Both  transabdominal and transvaginal ultrasound examinations of the pelvis were performed. Transabdominal technique was performed for global imaging of the pelvis including uterus, ovaries, adnexal regions, and pelvic cul-de-sac. It was necessary to proceed with endovaginal exam following the transabdominal exam to visualize the uterus, endometrium, ovaries, and adnexa. Color and duplex Doppler ultrasound was utilized to evaluate blood flow to the ovaries. COMPARISON:  CT scan dated 05/22/2009 FINDINGS: Uterus Measurements: 7.2 x 4.3 x 5.2 cm. No fibroids or other mass visualized. Endometrium Thickness: 4.7 mm.  No focal abnormality visualized. Right ovary Measurements: 2.3 x 1.1 x 1.5 cm. Normal appearance/no adnexal mass. Left ovary Measurements: 2.5 x 2.0 x 2.2 cm. Normal appearance/no adnexal mass. Pulsed Doppler evaluation of both ovaries demonstrates normal low-resistance arterial and venous waveforms. Other findings No abnormal free fluid. IMPRESSION: Normal pelvic ultrasound.  Normal perfusion to both ovaries. Electronically Signed   By: Lorriane Shire M.D.   On: 05/23/2015 12:24   US Pelvis Complete  05/23/2015  CLINICAL DATA:  Right lower quadrant pain. EXAM: TRANSABDOMINAL AND TRANSVAGINAL ULTRASOUND OF PELVIS DOPPLER ULTRASOUND OF OVARIES TECHNIQUE: Both transabdominal and transvaginal ultrasound examinations of the pelvis were performed. Transabdominal technique was performed for global imaging of the pelvis including uterus, ovaries, adnexal regions, and pelvic cul-de-sac. It was necessary to proceed with endovaginal exam following the transabdominal exam to visualize the uterus, endometrium, ovaries, and adnexa. Color and duplex Doppler ultrasound was utilized to evaluate blood flow to the ovaries. COMPARISON:  CT scan dated 05/22/2009 FINDINGS: Uterus Measurements: 7.2 x 4.3 x 5.2 cm. No fibroids or other mass visualized. Endometrium Thickness: 4.7 mm.  No focal abnormality visualized. Right ovary  Measurements: 2.3 x 1.1 x 1.5 cm. Normal appearance/no adnexal mass. Left ovary Measurements: 2.5 x 2.0 x 2.2 cm. Normal appearance/no adnexal mass. Pulsed Doppler evaluation of both ovaries demonstrates normal low-resistance arterial and venous waveforms. Other findings No abnormal free fluid. IMPRESSION: Normal pelvic ultrasound.  Normal perfusion to both ovaries. Electronically Signed   By: Lorriane Shire M.D.   On: 05/23/2015 12:24   Ct Abdomen Pelvis W Contrast  05/23/2015  CLINICAL DATA:  Right lower quadrant pain that awoke patient this morning. EXAM: CT ABDOMEN AND PELVIS WITH CONTRAST TECHNIQUE: Multidetector CT imaging of the abdomen and pelvis was performed using the standard protocol following bolus administration of intravenous contrast. CONTRAST:  187mL OMNIPAQUE IOHEXOL 300 MG/ML  SOLN COMPARISON:  None. FINDINGS: Lower chest and abdominal wall:  No contributory findings. Hepatobiliary: No focal liver abnormality.No evidence of biliary obstruction or stone. Pancreas: Unremarkable. Spleen: Unremarkable. Adrenals/Urinary Tract: Negative adrenals. No hydronephrosis or stone. Unremarkable bladder. Reproductive:No pathologic findings. Stomach/Bowel: No obstruction. Nonspecific submucosal fat deposition in the colon, especially right-sided. No appendicitis. Vascular/Lymphatic: No acute vascular abnormality. No mass or adenopathy. Peritoneal: No ascites or pneumoperitoneum. Musculoskeletal: No explanation for pain. IMPRESSION: Negative.  No explanation for abdominal pain. Electronically Signed   By: Monte Fantasia M.D.   On: 05/23/2015 10:45   [place] Art/ven Flow Abd Pelv Doppler  05/23/2015  CLINICAL DATA:  Right lower quadrant pain. EXAM: TRANSABDOMINAL AND TRANSVAGINAL ULTRASOUND OF PELVIS DOPPLER ULTRASOUND OF  OVARIES TECHNIQUE: Both transabdominal and transvaginal ultrasound examinations of the pelvis were performed. Transabdominal technique was performed for global imaging of the pelvis including  uterus, ovaries, adnexal regions, and pelvic cul-de-sac. It was necessary to proceed with endovaginal exam following the transabdominal exam to visualize the uterus, endometrium, ovaries, and adnexa. Color and duplex Doppler ultrasound was utilized to evaluate blood flow to the ovaries. COMPARISON:  CT scan dated 05/22/2009 FINDINGS: Uterus Measurements: 7.2 x 4.3 x 5.2 cm. No fibroids or other mass visualized. Endometrium Thickness: 4.7 mm.  No focal abnormality visualized. Right ovary Measurements: 2.3 x 1.1 x 1.5 cm. Normal appearance/no adnexal mass. Left ovary Measurements: 2.5 x 2.0 x 2.2 cm. Normal appearance/no adnexal mass. Pulsed Doppler evaluation of both ovaries demonstrates normal low-resistance arterial and venous waveforms. Other findings No abnormal free fluid. IMPRESSION: Normal pelvic ultrasound.  Normal perfusion to both ovaries. Electronically Signed   By: Lorriane Shire M.D.   On: 05/23/2015 12:24   I have personally reviewed and evaluated these lab results as part of my medical decision-making.   EKG Interpretation None     Medications  sodium chloride 0.9 % bolus 1,000 mL (0 mLs Intravenous Stopped 05/23/15 1100)  ondansetron (ZOFRAN) injection 4 mg (4 mg Intravenous Given 05/23/15 0900)  morphine 4 MG/ML injection 4 mg (4 mg Intravenous Given 05/23/15 0859)  iohexol (OMNIPAQUE) 300 MG/ML solution 100 mL (100 mLs Intravenous Contrast Given 05/23/15 1026)  morphine 4 MG/ML injection 4 mg (4 mg Intravenous Given 05/23/15 1212)    MDM   Final diagnoses:  RLQ abdominal pain   Pt w/ 1 day of RLQ pain, nausea, and decreased PO intake. On exam she was nontoxic but uncomfortable. She was mildly hypertensive but afebrile. Right lower quadrant tenderness noted with mild rebound, no guarding, no peritonitis. Obtained above lab work and gave the patient Zofran, morphine, and an IV fluid bolus. Given location of pain, obtained CT of abdomen and pelvis to rule out  Appendicitis. CT was  negative and pt continued to have pain, thus obtained pelvic US to r/o ovarian pathology. [place] was normal and showed normal blood flow to ovaries. On reexamination, pt was comfortable and tolerating liquids. No fevers or vomiting to suggest acute infection. I discussed supportive care instructions and extensively reviewed return precautions. Instructed to follow-up with PCP in a few days if symptoms continue. Patient voiced understanding and was discharged in satisfactory condition.  Sharlett Iles, MD 05/23/15 [phone]

## 2015-05-23 NOTE — ED Notes (Addendum)
Pt c/o intermittent RLQ pain and nausea starting this morning.  Pain score 8/10.  Pt has not taken anything for pain.  Denies GU complaints.  Denies GI Hx.  Pt reports recent "very heavy" menstrual cycle.  Sts it was heavier than normal.

## 2015-05-23 NOTE — Discharge Instructions (Signed)

## 2015-06-07 ENCOUNTER — Other Ambulatory Visit: Payer: Self-pay

## 2015-06-07 DIAGNOSIS — Z1231 Encounter for screening mammogram for malignant neoplasm of breast: Secondary | ICD-10-CM

## 2015-07-06 ENCOUNTER — Ambulatory Visit
Admission: RE | Admit: 2015-07-06 | Discharge: 2015-07-06 | Disposition: A | Discharge status: Home / Self Care | Payer: 59 | Source: Ambulatory Visit

## 2015-07-06 DIAGNOSIS — Z1231 Encounter for screening mammogram for malignant neoplasm of breast: Secondary | ICD-10-CM

## 2016-05-14 ENCOUNTER — Other Ambulatory Visit: Payer: Self-pay | Admitting: Obstetrics and Gynecology

## 2016-05-14 ENCOUNTER — Other Ambulatory Visit: Payer: Self-pay | Admitting: Obstetrics & Gynecology

## 2016-05-14 DIAGNOSIS — Z1231 Encounter for screening mammogram for malignant neoplasm of breast: Secondary | ICD-10-CM

## 2016-07-06 ENCOUNTER — Ambulatory Visit
Admission: RE | Admit: 2016-07-06 | Discharge: 2016-07-06 | Disposition: A | Discharge status: Home / Self Care | Payer: 59 | Source: Ambulatory Visit | Attending: Obstetrics & Gynecology | Admitting: Obstetrics & Gynecology

## 2016-07-06 ENCOUNTER — Other Ambulatory Visit: Payer: Self-pay | Admitting: Obstetrics and Gynecology

## 2016-07-06 DIAGNOSIS — Z1231 Encounter for screening mammogram for malignant neoplasm of breast: Secondary | ICD-10-CM

## 2016-07-06 LAB — HM MAMMOGRAPHY

## 2016-07-10 ENCOUNTER — Other Ambulatory Visit: Payer: Self-pay | Admitting: Obstetrics and Gynecology

## 2016-07-10 DIAGNOSIS — R928 Other abnormal and inconclusive findings on diagnostic imaging of breast: Secondary | ICD-10-CM

## 2016-07-11 ENCOUNTER — Other Ambulatory Visit: Payer: Self-pay | Admitting: Obstetrics and Gynecology

## 2016-07-11 ENCOUNTER — Ambulatory Visit
Admission: RE | Admit: 2016-07-11 | Discharge: 2016-07-11 | Disposition: A | Discharge status: Home / Self Care | Payer: 59 | Source: Ambulatory Visit | Attending: Obstetrics and Gynecology | Admitting: Obstetrics and Gynecology

## 2016-07-11 DIAGNOSIS — R928 Other abnormal and inconclusive findings on diagnostic imaging of breast: Secondary | ICD-10-CM

## 2016-07-11 DIAGNOSIS — N6320 Unspecified lump in the left breast, unspecified quadrant: Secondary | ICD-10-CM

## 2016-07-11 LAB — HM MAMMOGRAPHY

## 2016-07-12 ENCOUNTER — Ambulatory Visit
Admission: RE | Admit: 2016-07-12 | Discharge: 2016-07-12 | Disposition: A | Discharge status: Home / Self Care | Payer: 59 | Source: Ambulatory Visit | Attending: Obstetrics and Gynecology | Admitting: Obstetrics and Gynecology

## 2016-07-12 ENCOUNTER — Other Ambulatory Visit: Payer: Self-pay | Admitting: Obstetrics and Gynecology

## 2016-07-12 ENCOUNTER — Other Ambulatory Visit: Payer: Self-pay

## 2016-07-12 DIAGNOSIS — N6320 Unspecified lump in the left breast, unspecified quadrant: Secondary | ICD-10-CM

## 2016-07-13 ENCOUNTER — Other Ambulatory Visit: Payer: Self-pay

## 2016-07-31 ENCOUNTER — Ambulatory Visit: Payer: Self-pay | Admitting: General Surgery

## 2016-07-31 DIAGNOSIS — D242 Benign neoplasm of left breast: Secondary | ICD-10-CM

## 2016-08-07 HISTORY — PX: BREAST EXCISIONAL BIOPSY: SUR124

## 2016-08-15 ENCOUNTER — Other Ambulatory Visit: Payer: Self-pay | Admitting: General Surgery

## 2016-08-15 DIAGNOSIS — D242 Benign neoplasm of left breast: Secondary | ICD-10-CM

## 2016-08-23 ENCOUNTER — Encounter (HOSPITAL_BASED_OUTPATIENT_CLINIC_OR_DEPARTMENT_OTHER): Payer: Self-pay | Admitting: *Deleted

## 2016-08-28 ENCOUNTER — Ambulatory Visit
Admission: RE | Admit: 2016-08-28 | Discharge: 2016-08-28 | Disposition: A | Discharge status: Home / Self Care | Payer: 59 | Source: Ambulatory Visit | Attending: General Surgery | Admitting: General Surgery

## 2016-08-28 DIAGNOSIS — D242 Benign neoplasm of left breast: Secondary | ICD-10-CM

## 2016-08-28 NOTE — Progress Notes (Signed)
Patient given boost with instruction to drink by 0900, verbalized understanding.

## 2016-08-30 ENCOUNTER — Encounter (HOSPITAL_BASED_OUTPATIENT_CLINIC_OR_DEPARTMENT_OTHER): Payer: Self-pay | Admitting: Anesthesiology

## 2016-08-30 ENCOUNTER — Ambulatory Visit (HOSPITAL_BASED_OUTPATIENT_CLINIC_OR_DEPARTMENT_OTHER): Payer: 59 | Admitting: Anesthesiology

## 2016-08-30 ENCOUNTER — Encounter (HOSPITAL_BASED_OUTPATIENT_CLINIC_OR_DEPARTMENT_OTHER)
Admission: RE | Disposition: A | Discharge status: Home / Self Care | Payer: Self-pay | Source: Ambulatory Visit | Attending: General Surgery

## 2016-08-30 ENCOUNTER — Ambulatory Visit (HOSPITAL_BASED_OUTPATIENT_CLINIC_OR_DEPARTMENT_OTHER)
Admission: RE | Admit: 2016-08-30 | Discharge: 2016-08-30 | Disposition: A | Discharge status: Home / Self Care | Payer: 59 | Source: Ambulatory Visit | Attending: General Surgery | Admitting: General Surgery

## 2016-08-30 ENCOUNTER — Ambulatory Visit
Admission: RE | Admit: 2016-08-30 | Discharge: 2016-08-30 | Disposition: A | Discharge status: Home / Self Care | Payer: 59 | Source: Ambulatory Visit | Attending: General Surgery | Admitting: General Surgery

## 2016-08-30 DIAGNOSIS — Z808 Family history of malignant neoplasm of other organs or systems: Secondary | ICD-10-CM | POA: Insufficient documentation

## 2016-08-30 DIAGNOSIS — Z803 Family history of malignant neoplasm of breast: Secondary | ICD-10-CM | POA: Diagnosis not present

## 2016-08-30 DIAGNOSIS — Z833 Family history of diabetes mellitus: Secondary | ICD-10-CM | POA: Diagnosis not present

## 2016-08-30 DIAGNOSIS — Z880 Allergy status to penicillin: Secondary | ICD-10-CM | POA: Diagnosis not present

## 2016-08-30 DIAGNOSIS — Z811 Family history of alcohol abuse and dependence: Secondary | ICD-10-CM | POA: Diagnosis not present

## 2016-08-30 DIAGNOSIS — D242 Benign neoplasm of left breast: Secondary | ICD-10-CM | POA: Diagnosis present

## 2016-08-30 HISTORY — DX: Headache: R51

## 2016-08-30 HISTORY — PX: BREAST LUMPECTOMY WITH RADIOACTIVE SEED LOCALIZATION: SHX6424

## 2016-08-30 HISTORY — DX: Headache, unspecified: R51.9

## 2016-08-30 HISTORY — DX: Benign neoplasm of left breast: D24.2

## 2016-08-30 SURGERY — BREAST LUMPECTOMY WITH RADIOACTIVE SEED LOCALIZATION
Anesthesia: General | Site: Breast | Laterality: Left

## 2016-08-30 MED ORDER — FENTANYL CITRATE (PF) 100 MCG/2ML IJ SOLN: 25.0000 ug | INTRAMUSCULAR | Status: DC | PRN

## 2016-08-30 MED ORDER — HYDROCODONE-ACETAMINOPHEN 5-325 MG PO TABS: 1.0000 | ORAL_TABLET | Freq: Once | ORAL | Status: DC

## 2016-08-30 MED ORDER — HYDROCODONE-ACETAMINOPHEN 5-325 MG PO TABS
ORAL_TABLET | ORAL | Status: AC
  Filled 2016-08-30: qty 1

## 2016-08-30 MED ORDER — GABAPENTIN 300 MG PO CAPS
ORAL_CAPSULE | ORAL | Status: AC
  Filled 2016-08-30: qty 1

## 2016-08-30 MED ORDER — ONDANSETRON HCL 4 MG/2ML IJ SOLN
INTRAMUSCULAR | Status: AC
  Filled 2016-08-30: qty 2

## 2016-08-30 MED ORDER — METOCLOPRAMIDE HCL 5 MG/ML IJ SOLN: 10.0000 mg | Freq: Once | INTRAMUSCULAR | Status: DC | PRN

## 2016-08-30 MED ORDER — CHLORHEXIDINE GLUCONATE CLOTH 2 % EX PADS: 6.0000 | MEDICATED_PAD | Freq: Once | CUTANEOUS | Status: DC

## 2016-08-30 MED ORDER — LACTATED RINGERS IV SOLN: INTRAVENOUS | Status: DC

## 2016-08-30 MED ORDER — CELECOXIB 200 MG PO CAPS
ORAL_CAPSULE | ORAL | Status: AC
  Filled 2016-08-30: qty 2

## 2016-08-30 MED ORDER — VANCOMYCIN HCL IN DEXTROSE 1-5 GM/200ML-% IV SOLN
1000.0000 mg | INTRAVENOUS | Status: AC
  Administered 2016-08-30: 1000 mg via INTRAVENOUS

## 2016-08-30 MED ORDER — BUPIVACAINE HCL (PF) 0.25 % IJ SOLN
INTRAMUSCULAR | Status: DC | PRN
  Administered 2016-08-30: 20 mL

## 2016-08-30 MED ORDER — MIDAZOLAM HCL 2 MG/2ML IJ SOLN
1.0000 mg | INTRAMUSCULAR | Status: DC | PRN
  Administered 2016-08-30: 2 mg via INTRAVENOUS

## 2016-08-30 MED ORDER — FENTANYL CITRATE (PF) 100 MCG/2ML IJ SOLN
50.0000 ug | INTRAMUSCULAR | Status: DC | PRN
  Administered 2016-08-30: 50 ug via INTRAVENOUS
  Administered 2016-08-30: 100 ug via INTRAVENOUS

## 2016-08-30 MED ORDER — VANCOMYCIN HCL IN DEXTROSE 1-5 GM/200ML-% IV SOLN
INTRAVENOUS | Status: AC
  Filled 2016-08-30: qty 200

## 2016-08-30 MED ORDER — LACTATED RINGERS IV SOLN
INTRAVENOUS | Status: DC
  Administered 2016-08-30 (×2): via INTRAVENOUS

## 2016-08-30 MED ORDER — ONDANSETRON HCL 4 MG/2ML IJ SOLN
INTRAMUSCULAR | Status: DC | PRN
  Administered 2016-08-30: 4 mg via INTRAVENOUS

## 2016-08-30 MED ORDER — CELECOXIB 400 MG PO CAPS
400.0000 mg | ORAL_CAPSULE | ORAL | Status: AC
  Administered 2016-08-30: 400 mg via ORAL

## 2016-08-30 MED ORDER — DEXAMETHASONE SODIUM PHOSPHATE 10 MG/ML IJ SOLN
INTRAMUSCULAR | Status: AC
  Filled 2016-08-30: qty 1

## 2016-08-30 MED ORDER — FENTANYL CITRATE (PF) 100 MCG/2ML IJ SOLN
INTRAMUSCULAR | Status: AC
  Filled 2016-08-30: qty 2

## 2016-08-30 MED ORDER — HYDROCODONE-ACETAMINOPHEN 5-325 MG PO TABS: 1.0000 | ORAL_TABLET | ORAL | 0 refills | Status: DC | PRN

## 2016-08-30 MED ORDER — SCOPOLAMINE 1 MG/3DAYS TD PT72: 1.0000 | MEDICATED_PATCH | Freq: Once | TRANSDERMAL | Status: DC | PRN

## 2016-08-30 MED ORDER — GABAPENTIN 300 MG PO CAPS
300.0000 mg | ORAL_CAPSULE | ORAL | Status: AC
  Administered 2016-08-30: 300 mg via ORAL

## 2016-08-30 MED ORDER — MIDAZOLAM HCL 2 MG/2ML IJ SOLN
INTRAMUSCULAR | Status: AC
  Filled 2016-08-30: qty 2

## 2016-08-30 MED ORDER — MEPERIDINE HCL 25 MG/ML IJ SOLN: 6.2500 mg | INTRAMUSCULAR | Status: DC | PRN

## 2016-08-30 MED ORDER — DEXAMETHASONE SODIUM PHOSPHATE 4 MG/ML IJ SOLN
INTRAMUSCULAR | Status: DC | PRN
  Administered 2016-08-30: 10 mg via INTRAVENOUS

## 2016-08-30 MED ORDER — ACETAMINOPHEN 500 MG PO TABS
1000.0000 mg | ORAL_TABLET | ORAL | Status: AC
  Administered 2016-08-30: 1000 mg via ORAL

## 2016-08-30 MED ORDER — ACETAMINOPHEN 500 MG PO TABS
ORAL_TABLET | ORAL | Status: AC
  Filled 2016-08-30: qty 2

## 2016-08-30 MED ORDER — BUPIVACAINE HCL (PF) 0.25 % IJ SOLN
INTRAMUSCULAR | Status: AC
  Filled 2016-08-30: qty 30

## 2016-08-30 MED ORDER — PROPOFOL 10 MG/ML IV BOLUS
INTRAVENOUS | Status: DC | PRN
  Administered 2016-08-30: 150 mg via INTRAVENOUS
  Administered 2016-08-30: 50 mg via INTRAVENOUS

## 2016-08-30 SURGICAL SUPPLY — 47 items
ADH SKN CLS APL DERMABOND .7 (GAUZE/BANDAGES/DRESSINGS) ×1
APPLIER CLIP 9.375 MED OPEN (MISCELLANEOUS)
APR CLP MED 9.3 20 MLT OPN (MISCELLANEOUS)
BLADE SURG 15 STRL LF DISP TIS (BLADE) ×1 IMPLANT
BLADE SURG 15 STRL SS (BLADE) ×2
CANISTER SUC SOCK COL 7IN (MISCELLANEOUS) ×1 IMPLANT
CANISTER SUCT 1200ML W/VALVE (MISCELLANEOUS) ×2 IMPLANT
CHLORAPREP W/TINT 26ML (MISCELLANEOUS) ×2 IMPLANT
CLIP APPLIE 9.375 MED OPEN (MISCELLANEOUS) IMPLANT
COVER BACK TABLE 60X90IN (DRAPES) ×2 IMPLANT
COVER MAYO STAND STRL (DRAPES) ×2 IMPLANT
COVER PROBE W GEL 5X96 (DRAPES) ×2 IMPLANT
DECANTER SPIKE VIAL GLASS SM (MISCELLANEOUS) IMPLANT
DERMABOND ADVANCED (GAUZE/BANDAGES/DRESSINGS) ×1
DERMABOND ADVANCED .7 DNX12 (GAUZE/BANDAGES/DRESSINGS) ×1 IMPLANT
DEVICE DUBIN W/COMP PLATE 8390 (MISCELLANEOUS) ×2 IMPLANT
DRAPE LAPAROSCOPIC ABDOMINAL (DRAPES) ×2 IMPLANT
DRAPE UTILITY XL STRL (DRAPES) ×2 IMPLANT
ELECT COATED BLADE 2.86 ST (ELECTRODE) ×2 IMPLANT
ELECT REM PT RETURN 9FT ADLT (ELECTROSURGICAL) ×2
ELECTRODE REM PT RTRN 9FT ADLT (ELECTROSURGICAL) ×1 IMPLANT
GLOVE BIO SURGEON STRL SZ 6.5 (GLOVE) ×1 IMPLANT
GLOVE BIO SURGEON STRL SZ7.5 (GLOVE) ×3 IMPLANT
GLOVE BIOGEL PI IND STRL 7.0 (GLOVE) IMPLANT
GLOVE BIOGEL PI INDICATOR 7.0 (GLOVE) ×2
GLOVE EXAM NITRILE EXT CUFF MD (GLOVE) ×1 IMPLANT
GLOVE SURG SS PI 7.0 STRL IVOR (GLOVE) ×1 IMPLANT
GOWN STRL REUS W/ TWL LRG LVL3 (GOWN DISPOSABLE) ×2 IMPLANT
GOWN STRL REUS W/TWL LRG LVL3 (GOWN DISPOSABLE) ×6
ILLUMINATOR WAVEGUIDE N/F (MISCELLANEOUS) IMPLANT
KIT MARKER MARGIN INK (KITS) ×2 IMPLANT
LIGHT WAVEGUIDE WIDE FLAT (MISCELLANEOUS) IMPLANT
NDL HYPO 25X1 1.5 SAFETY (NEEDLE) IMPLANT
NEEDLE HYPO 25X1 1.5 SAFETY (NEEDLE) ×2 IMPLANT
NS IRRIG 1000ML POUR BTL (IV SOLUTION) ×1 IMPLANT
PACK BASIN DAY SURGERY FS (CUSTOM PROCEDURE TRAY) ×2 IMPLANT
PENCIL BUTTON HOLSTER BLD 10FT (ELECTRODE) ×2 IMPLANT
SLEEVE SCD COMPRESS KNEE MED (MISCELLANEOUS) ×2 IMPLANT
SPONGE LAP 18X18 X RAY DECT (DISPOSABLE) ×2 IMPLANT
SUT MON AB 4-0 PC3 18 (SUTURE) ×1 IMPLANT
SUT SILK 2 0 SH (SUTURE) IMPLANT
SUT VICRYL 3-0 CR8 SH (SUTURE) ×2 IMPLANT
SYR CONTROL 10ML LL (SYRINGE) ×1 IMPLANT
TOWEL OR 17X24 6PK STRL BLUE (TOWEL DISPOSABLE) ×2 IMPLANT
TOWEL OR NON WOVEN STRL DISP B (DISPOSABLE) ×1 IMPLANT
TUBE CONNECTING 20X1/4 (TUBING) ×2 IMPLANT
YANKAUER SUCT BULB TIP NO VENT (SUCTIONS) ×1 IMPLANT

## 2016-08-30 NOTE — Discharge Instructions (Signed)

## 2016-08-30 NOTE — Anesthesia Postprocedure Evaluation (Signed)
Anesthesia Post Note  Patient: Tanya Warren  Procedure(s) Performed: Procedure(s) (LRB): LEFT BREAST LUMPECTOMY WITH RADIOACTIVE SEED LOCALIZATION (Left)  Patient location during evaluation: PACU Anesthesia Type: General Level of consciousness: awake and alert Pain management: pain level controlled Vital Signs Assessment: post-procedure vital signs reviewed and stable Respiratory status: spontaneous breathing, nonlabored ventilation, respiratory function stable and patient connected to nasal cannula oxygen Cardiovascular status: blood pressure returned to baseline and stable Postop Assessment: no signs of nausea or vomiting Anesthetic complications: no       Last Vitals:  Vitals:   08/30/16 1415 08/30/16 1434  BP: (!) 148/92 (!) 155/82  Pulse: 87 71  Resp: 13 16  Temp:  36.6 C    Last Pain:  Vitals:   08/30/16 1434  TempSrc: Oral  PainSc: 4                  Tiajuana Amass

## 2016-08-30 NOTE — Anesthesia Procedure Notes (Signed)
Procedure Name: LMA Insertion Date/Time: 08/30/2016 1:05 PM Performed by: Maryella Shivers Pre-anesthesia Checklist: Patient identified, Emergency Drugs available, Suction available and Patient being monitored Patient Re-evaluated:Patient Re-evaluated prior to inductionOxygen Delivery Method: Circle system utilized Preoxygenation: Pre-oxygenation with 100% oxygen Intubation Type: IV induction Ventilation: Mask ventilation without difficulty LMA: LMA inserted LMA Size: 4.0 Number of attempts: 1 Airway Equipment and Method: Bite block Placement Confirmation: positive ETCO2 Tube secured with: Tape Dental Injury: Teeth and Oropharynx as per pre-operative assessment

## 2016-08-30 NOTE — Interval H&P Note (Signed)
History and Physical Interval Note:  08/30/2016 12:41 PM  Tanya Warren  has presented today for surgery, with the diagnosis of left breast papilloma  The various methods of treatment have been discussed with the patient and family. After consideration of risks, benefits and other options for treatment, the patient has consented to  Procedure(s): LEFT BREAST LUMPECTOMY WITH RADIOACTIVE SEED LOCALIZATION (Left) as a surgical intervention .  The patient's history has been reviewed, patient examined, no change in status, stable for surgery.  I have reviewed the patient's chart and labs.  Questions were answered to the patient's satisfaction.     TOTH III,PAUL S

## 2016-08-30 NOTE — H&P (Signed)
Tanya Warren  Location: [facility] Patient #: [id number] DOB: 05-25-1970 Single / Language: Cleophus Molt / Race: White Female   History of Present Illness  The patient is a 46 year old female who presents with a breast mass. We are asked to see the patient in consultation by Dr. Ammie Ferrier to evaluate her for a left breast intraductal papilloma. The patient is a 46 year old white female who recently went for a routine screening mammogram. At that time she was found to have a 1.5 cm mass within the duct in the lower aspect of the left central breast. This was biopsied and came back as an intraductal papilloma. She denies any breast pain or discharge from the nipple. She does not take any hormone replacement. She does have a family history of breast cancer in her paternal any. She is very concerned about the mass and would like to have it removed.   Past Surgical History  No pertinent past surgical history   Diagnostic Studies History  Colonoscopy  never Mammogram  within last year Pap Smear  1-5 years ago  Allergies  Cinnamon *ALTERNATIVE MEDICINES*  Anaphylaxis. Penicillins  Anaphylaxis, Rash. Allantoin *CHEMICALS*  Neosporin Original *DERMATOLOGICALS*  Swelling, Rash.  Medication History  No Current Medications Medications Reconciled  Social History Alcohol use  Occasional alcohol use. No caffeine use  No drug use  Tobacco use  Never smoker.  Family History  Alcohol Abuse  Sister. Breast Cancer  Family Members In General. Diabetes Mellitus  Father. Heart Disease  Father. Heart disease in female family member before age 80  Melanoma  Mother.  Pregnancy / Birth History  Age at menarche  16 years. Contraceptive History  Contraceptive implant. Gravida  4 Maternal age  53-35 Para  2 Regular periods   Other Problems  No pertinent past medical history     Review of Systems  General Not Present- Appetite Loss, Chills,  Fatigue, Fever, Night Sweats, Weight Gain and Weight Loss. Skin Not Present- Change in Wart/Mole, Dryness, Hives, Jaundice, New Lesions, Non-Healing Wounds, Rash and Ulcer. HEENT Not Present- Earache, Hearing Loss, Hoarseness, Nose Bleed, Oral Ulcers, Ringing in the Ears, Seasonal Allergies, Sinus Pain, Sore Throat, Visual Disturbances, Wears glasses/contact lenses and Yellow Eyes. Respiratory Not Present- Bloody sputum, Chronic Cough, Difficulty Breathing, Snoring and Wheezing. Breast Present- Breast Mass. Not Present- Breast Pain, Nipple Discharge and Skin Changes. Cardiovascular Not Present- Chest Pain, Difficulty Breathing Lying Down, Leg Cramps, Palpitations, Rapid Heart Rate, Shortness of Breath and Swelling of Extremities. Gastrointestinal Not Present- Abdominal Pain, Bloating, Bloody Stool, Change in Bowel Habits, Chronic diarrhea, Constipation, Difficulty Swallowing, Excessive gas, Gets full quickly at meals, Hemorrhoids, Indigestion, Nausea, Rectal Pain and Vomiting. Female Genitourinary Not Present- Frequency, Nocturia, Painful Urination, Pelvic Pain and Urgency. Musculoskeletal Not Present- Back Pain, Joint Pain, Joint Stiffness, Muscle Pain, Muscle Weakness and Swelling of Extremities. Neurological Not Present- Decreased Memory, Fainting, Headaches, Numbness, Seizures, Tingling, Tremor, Trouble walking and Weakness. Psychiatric Not Present- Anxiety, Bipolar, Change in Sleep Pattern, Depression, Fearful and Frequent crying. Endocrine Not Present- Cold Intolerance, Excessive Hunger, Hair Changes, Heat Intolerance, Hot flashes and New Diabetes. Hematology Not Present- Blood Thinners, Easy Bruising, Excessive bleeding, Gland problems, HIV and Persistent Infections.  Vitals  Weight: 172 lb Height: 67in Body Surface Area: 1.9 m Body Mass Index: 26.94 kg/m  Temp.: 98.55F  Pulse: 86 (Regular)  BP: 170/100 (Sitting, Left Arm, Standard)       Physical Exam General Mental  Status-Alert. General Appearance-Consistent with stated age. Hydration-Well  hydrated. Voice-Normal.  Head and Neck Head-normocephalic, atraumatic with no lesions or palpable masses. Trachea-midline. Thyroid Gland Characteristics - normal size and consistency.  Eye Eyeball - Bilateral-Extraocular movements intact. Sclera/Conjunctiva - Bilateral-No scleral icterus.  Chest and Lung Exam Chest and lung exam reveals -quiet, even and easy respiratory effort with no use of accessory muscles and on auscultation, normal breath sounds, no adventitious sounds and normal vocal resonance. Inspection Chest Wall - Normal. Back - normal.  Breast Note: There is dense symmetric tissue bilaterally. The density is more prominent in the outer aspect of both breasts. Other than this there is no palpable mass in either breast. There is no palpable axillary, supraclavicular, or cervical lymphadenopathy.   Cardiovascular Cardiovascular examination reveals -normal heart sounds, regular rate and rhythm with no murmurs and normal pedal pulses bilaterally.  Abdomen Inspection Inspection of the abdomen reveals - No Hernias. Skin - Scar - no surgical scars. Palpation/Percussion Palpation and Percussion of the abdomen reveal - Soft, Non Tender, No Rebound tenderness, No Rigidity (guarding) and No hepatosplenomegaly. Auscultation Auscultation of the abdomen reveals - Bowel sounds normal.  Neurologic Neurologic evaluation reveals -alert and oriented x 3 with no impairment of recent or remote memory. Mental Status-Normal.  Musculoskeletal Normal Exam - Left-Upper Extremity Strength Normal and Lower Extremity Strength Normal. Normal Exam - Right-Upper Extremity Strength Normal and Lower Extremity Strength Normal.  Lymphatic Head & Neck  General Head & Neck Lymphatics: Bilateral - Description - Normal. Axillary  General Axillary Region: Bilateral - Description - Normal.  Tenderness - Non Tender. Femoral & Inguinal  Generalized Femoral & Inguinal Lymphatics: Bilateral - Description - Normal. Tenderness - Non Tender.    Assessment & Plan  PAPILLOMA OF LEFT BREAST (D24.2) Impression: The patient appears to have a intraductal papilloma in the lower aspect of the left breast. Although the papilloma is benign and by itself does not confer an increased risk of breast cancer she is very concerned about it since she does have some family history of breast cancer and would like to have it removed. I think this is a reasonable thing to do. I have discussed with her in detail the risks and benefits of the operation as well as some of the technical aspects and she understands and wishes to proceed. I will plan for a left breast radioactive seed localized lumpectomy. Because of her anxiety I will also give her 1 Xanax spelled intake the night before surgery. Any further benzodiazepines will need to be prescribed by her medical doctor. Current Plans Pt Education - Breast Diseases: discussed with patient and provided information. Started Xanax XR 0.5MG , 1 (one) Tablet at bedtime, #1, 07/31/2016, No Refill.

## 2016-08-30 NOTE — Anesthesia Preprocedure Evaluation (Signed)
Anesthesia Evaluation  Patient identified by MRN, date of birth, ID band Patient awake    Reviewed: Allergy & Precautions, NPO status , Patient's Chart, lab work & pertinent test results  Airway Mallampati: II  TM Distance: >3 FB Neck ROM: Full    Dental no notable dental hx.    Pulmonary neg pulmonary ROS,    Pulmonary exam normal breath sounds clear to auscultation       Cardiovascular negative cardio ROS Normal cardiovascular exam Rhythm:Regular Rate:Normal     Neuro/Psych negative neurological ROS  negative psych ROS   GI/Hepatic negative GI ROS, Neg liver ROS,   Endo/Other  negative endocrine ROS  Renal/GU negative Renal ROS  negative genitourinary   Musculoskeletal negative musculoskeletal ROS (+)   Abdominal   Peds negative pediatric ROS (+)  Hematology negative hematology ROS (+)   Anesthesia Other Findings   Reproductive/Obstetrics negative OB ROS                             Anesthesia Physical Anesthesia Plan  ASA: II  Anesthesia Plan: General   Post-op Pain Management:    Induction: Intravenous  Airway Management Planned: LMA  Additional Equipment:   Intra-op Plan:   Post-operative Plan: Extubation in OR  Informed Consent: I have reviewed the patients History and Physical, chart, labs and discussed the procedure including the risks, benefits and alternatives for the proposed anesthesia with the patient or authorized representative who has indicated his/her understanding and acceptance.   Dental advisory given  Plan Discussed with: CRNA  Anesthesia Plan Comments:         Anesthesia Quick Evaluation

## 2016-08-30 NOTE — Transfer of Care (Signed)
Immediate Anesthesia Transfer of Care Note  Patient: Tanya Warren  Procedure(s) Performed: Procedure(s): LEFT BREAST LUMPECTOMY WITH RADIOACTIVE SEED LOCALIZATION (Left)  Patient Location: PACU  Anesthesia Type:General  Level of Consciousness: sedated  Airway & Oxygen Therapy: Patient Spontanous Breathing and Patient connected to face mask oxygen  Post-op Assessment: Report given to RN and Post -op Vital signs reviewed and stable  Post vital signs: Reviewed and stable  Last Vitals:  Vitals:   08/30/16 1056 08/30/16 1352  BP: (!) 160/95 135/86  Pulse: 90 85  Resp: 18 (P) 10  Temp: 36.8 C (P) 36.5 C    Last Pain:  Vitals:   08/30/16 1056  TempSrc: Oral      Patients Stated Pain Goal: 0 (89/21/19 4174)  Complications: No apparent anesthesia complications

## 2016-08-30 NOTE — Op Note (Signed)
08/30/2016  1:45 PM  PATIENT:  Tanya Warren  46 y.o. female  PRE-OPERATIVE DIAGNOSIS:  left breast papilloma  POST-OPERATIVE DIAGNOSIS:  left breast papilloma  PROCEDURE:  Procedure(s): LEFT BREAST LUMPECTOMY WITH RADIOACTIVE SEED LOCALIZATION (Left)  SURGEON:  Surgeon(s) and Role:    * Jovita Kussmaul, MD - Primary  PHYSICIAN ASSISTANT:   ASSISTANTS: none   ANESTHESIA:   local and general  EBL:  Total I/O In: 1300 [I.V.:1300] Out: -   BLOOD ADMINISTERED:none  DRAINS: none   LOCAL MEDICATIONS USED:  MARCAINE     SPECIMEN:  Source of Specimen:  left breast tissue  DISPOSITION OF SPECIMEN:  PATHOLOGY  COUNTS:  YES  TOURNIQUET:  * No tourniquets in log *  DICTATION: .Dragon Dictation   After informed consent was obtained the patient was brought to the operating room and placed in the supine position on the operating room table. After adequate induction of general anesthesia the patient's left breast was prepped with ChloraPrep, allowed to dry, and draped in usual sterile manner. An appropriate timeout was performed. Previously an I-125 seed was placed in the lower aspect of the left breast to mark an area of an intraductal papilloma. The neoprobe was set to I-125 in the area of radioactivity was readily identified. The area around this was infiltrated with quarter percent Marcaine. A small curvilinear incision was made along the edge of the areola on the inferior portion of the breast with a 15 blade knife. The incision was carried through the skin and subcutaneous tissue sharply with electrocautery. Dissection was then carried out towards the radioactive seed and with the guidance of the neoprobe. Once I approach the radioactive seed and then removed a circular portion of breast tissue around the radioactive seed will check in the area of radioactivity frequently. Once the specimen was removed it was oriented with the appropriate paint colors. A specimen radiograph was obtained  that showed the clip and seed to be in the center of the specimen. The specimen was then sent pathology for further evaluation. The wound was infiltrated with quarter percent Marcaine and irrigated with saline. Hemostasis was achieved using the Bovie electrocautery. The deep layer of the wound was then closed with layers of interrupted 3-0 Vicryl stitches. The skin was then closed with interrupted 4-0 Monocryl subcuticular stitches. Dermabond dressings were applied. The patient tolerated the procedure well. At the end of the case all needle sponge and his counts were correct. The patient was then awakened and taken to recovery in stable condition.  PLAN OF CARE: Discharge to home after PACU  PATIENT DISPOSITION:  PACU - hemodynamically stable.   Delay start of Pharmacological VTE agent (>24hrs) due to surgical blood loss or risk of bleeding: not applicable

## 2016-08-31 ENCOUNTER — Encounter (HOSPITAL_BASED_OUTPATIENT_CLINIC_OR_DEPARTMENT_OTHER): Payer: Self-pay | Admitting: General Surgery

## 2016-09-06 LAB — HM PAP SMEAR

## 2017-04-18 DIAGNOSIS — M7501 Adhesive capsulitis of right shoulder: Secondary | ICD-10-CM | POA: Insufficient documentation

## 2017-04-18 DIAGNOSIS — Z9889 Other specified postprocedural states: Secondary | ICD-10-CM | POA: Insufficient documentation

## 2017-04-18 DIAGNOSIS — S46091D Other injury of muscle(s) and tendon(s) of the rotator cuff of right shoulder, subsequent encounter: Secondary | ICD-10-CM | POA: Insufficient documentation

## 2017-04-18 DIAGNOSIS — M75112 Incomplete rotator cuff tear or rupture of left shoulder, not specified as traumatic: Secondary | ICD-10-CM | POA: Insufficient documentation

## 2017-04-18 DIAGNOSIS — S46019A Strain of muscle(s) and tendon(s) of the rotator cuff of unspecified shoulder, initial encounter: Secondary | ICD-10-CM | POA: Insufficient documentation

## 2017-07-15 ENCOUNTER — Other Ambulatory Visit: Payer: Self-pay | Admitting: Obstetrics and Gynecology

## 2017-07-15 DIAGNOSIS — Z1231 Encounter for screening mammogram for malignant neoplasm of breast: Secondary | ICD-10-CM

## 2017-08-06 ENCOUNTER — Other Ambulatory Visit: Payer: Self-pay | Admitting: Obstetrics & Gynecology

## 2017-08-06 ENCOUNTER — Ambulatory Visit
Admission: RE | Admit: 2017-08-06 | Discharge: 2017-08-06 | Disposition: A | Discharge status: Home / Self Care | Payer: 59 | Source: Ambulatory Visit | Attending: Obstetrics and Gynecology | Admitting: Obstetrics and Gynecology

## 2017-08-06 DIAGNOSIS — Z1231 Encounter for screening mammogram for malignant neoplasm of breast: Secondary | ICD-10-CM

## 2017-08-07 ENCOUNTER — Encounter: Payer: Self-pay | Admitting: Obstetrics & Gynecology

## 2017-08-07 ENCOUNTER — Ambulatory Visit (INDEPENDENT_AMBULATORY_CARE_PROVIDER_SITE_OTHER): Payer: 59 | Admitting: Obstetrics & Gynecology

## 2017-08-07 ENCOUNTER — Other Ambulatory Visit: Payer: Self-pay | Admitting: Obstetrics & Gynecology

## 2017-08-07 VITALS — BP 150/90

## 2017-08-07 DIAGNOSIS — R102 Pelvic and perineal pain: Secondary | ICD-10-CM

## 2017-08-07 DIAGNOSIS — R921 Mammographic calcification found on diagnostic imaging of breast: Secondary | ICD-10-CM

## 2017-08-07 DIAGNOSIS — N926 Irregular menstruation, unspecified: Secondary | ICD-10-CM | POA: Diagnosis not present

## 2017-08-07 LAB — URINALYSIS, COMPLETE W/RFL CULTURE
BACTERIA UA: NONE SEEN /HPF
Bilirubin Urine: NEGATIVE
Glucose, UA: NEGATIVE
HYALINE CAST: NONE SEEN /LPF
Hgb urine dipstick: NEGATIVE
Ketones, ur: NEGATIVE
Leukocyte Esterase: NEGATIVE
Nitrites, Initial: NEGATIVE
Protein, ur: NEGATIVE
RBC / HPF: NONE SEEN /HPF (ref 0–2)
Specific Gravity, Urine: 1.007 (ref 1.001–1.03)
WBC, UA: NONE SEEN /HPF (ref 0–5)
pH: 6 (ref 5.0–8.0)

## 2017-08-07 LAB — TSH: TSH: 0.72 m[IU]/L

## 2017-08-07 NOTE — Patient Instructions (Signed)
1. Irregular menses Menstrual periods every 3 to 4 weeks which is within normal, but will rule out endometrial pathology and hormonal dysfunction.  FSH, TSH and prolactin today.  Patient will follow-up for pelvic ultrasound to evaluate the endometrial lining. - FSH - TSH - Prolactin - US Transvaginal Non-OB; Future  2. Pelvic pain in female Left pelvic pain now resolved.  Possible ovulatory pain.  Urine analysis negative.  Patient will follow up with a pelvic ultrasound to assess her ovaries. - US Transvaginal Non-OB; Future  3. Breast calcification, left Left diagnostic mammogram with ultrasound scheduled tomorrow.  Report reviewed with patient.  Referral request signed.  Tanya Warren, it was a pleasure meeting you today!  I will inform you of your results as soon as they are available and we will see you soon for the pelvic ultrasound.

## 2017-08-07 NOTE — Addendum Note (Signed)
Addended by: Thurnell Garbe A on: 08/07/2017 02:44 PM   Modules accepted: Orders

## 2017-08-07 NOTE — Progress Notes (Signed)
    Tanya Warren 12/18/1970 [id number]        47 y.o. [id number] Same sex relationship  RP: New patient presenting for left pelvic pain and irregular menses x 1 month  HPI: Menstrual periods were were normal every month with 3 days of light flow.  Since the beginning of March her cycles have been about every 3 weeks, which made her cycles happen twice in the same month.  No hot flushes or night sweats.  Patient had left pelvic pain intermittently for the last month, but the pain is currently resolved.  No abnormal vaginal discharge.  No fever.  No urinary tract infection symptoms.  Normal bowel movements.  Same sex relationship.  Patient is upset this morning because she got the information that her screening mammogram showed calcifications on the left side.  A left diagnostic mammogram and ultrasound are scheduled for tomorrow.  Last year she had a removal of a left breast papilloma.  No first-degree relative with breast cancer.  Previous Paps normal.  Last Pap was Sep 06, 2016- with negative high risk HPV.   OB History  No data available    Past medical history,surgical history, problem list, medications, allergies, family history and social history were all reviewed and documented in the EPIC chart.   Directed ROS with pertinent positives and negatives documented in the history of present illness/assessment and plan.  Exam:  Vitals:   08/07/17 1202  BP: (!) 150/90   General appearance:  Normal  Abdomen: Normal  Gynecologic exam: Vulva normal.  Bimanual exam: Uterus anteverted, normal volume, mobile, nontender.  Cervix normal to palpation with no tenderness.  No adnexal mass and nontender bilaterally.  Normal vaginal secretions.  No current bleeding.  U/A:  Yellow, clear, Nit negative, WBC neg, RBC neg, Bacteria neg.   Assessment/Plan:  47 y.o. No obstetric history on file.   1. Irregular menses Menstrual periods every 3 to 4 weeks which is within normal, but will rule out  endometrial pathology and hormonal dysfunction.  FSH, TSH and prolactin today.  Patient will follow-up for pelvic ultrasound to evaluate the endometrial lining. - FSH - TSH - Prolactin - US Transvaginal Non-OB; Future  2. Pelvic pain in female Left pelvic pain now resolved.  Possible ovulatory pain.  Urine analysis negative.  Patient will follow up with a pelvic ultrasound to assess her ovaries. - US Transvaginal Non-OB; Future  3. Breast calcification, left Left diagnostic mammogram with ultrasound scheduled tomorrow.  Report reviewed with patient.  Referral request signed.  Counseling on above issues and coordination of care more than 50% for 30 minutes.  Princess Bruins MD, 12:13 PM 08/07/2017

## 2017-08-08 ENCOUNTER — Ambulatory Visit
Admission: RE | Admit: 2017-08-08 | Discharge: 2017-08-08 | Disposition: A | Discharge status: Home / Self Care | Payer: 59 | Source: Ambulatory Visit | Attending: Obstetrics & Gynecology | Admitting: Obstetrics & Gynecology

## 2017-08-08 DIAGNOSIS — R921 Mammographic calcification found on diagnostic imaging of breast: Secondary | ICD-10-CM

## 2017-08-08 LAB — NO CULTURE INDICATED

## 2017-08-08 LAB — PROLACTIN: Prolactin: 8.2 ng/mL

## 2017-08-08 LAB — FOLLICLE STIMULATING HORMONE: FSH: 17.8 m[IU]/mL

## 2017-08-09 ENCOUNTER — Encounter: Payer: Self-pay | Admitting: Obstetrics & Gynecology

## 2017-08-26 ENCOUNTER — Ambulatory Visit (INDEPENDENT_AMBULATORY_CARE_PROVIDER_SITE_OTHER): Payer: 59

## 2017-08-26 ENCOUNTER — Ambulatory Visit: Payer: 59 | Admitting: Obstetrics & Gynecology

## 2017-08-26 DIAGNOSIS — N926 Irregular menstruation, unspecified: Secondary | ICD-10-CM

## 2017-08-26 DIAGNOSIS — R102 Pelvic and perineal pain: Secondary | ICD-10-CM

## 2017-08-26 DIAGNOSIS — R921 Mammographic calcification found on diagnostic imaging of breast: Secondary | ICD-10-CM

## 2017-08-26 NOTE — Progress Notes (Signed)
    LANAI CONLEE 04/09/1971 [id number]        47 y.o.  [id number]  Same sex relationship.  RP: Left intermittent Pelvic pain and irregular menses for Pelvic US  HPI: On last visit 08/07/2017 we noted: Menstrual periods were were normal every month with 3 days of light flow.  Since the beginning of March her cycles have been about every 3 weeks, which made her cycles happen twice in the same month.  No hot flushes or night sweats.  Patient had left pelvic pain intermittently for the last month, but the pain is currently resolved.  No abnormal vaginal discharge.  No fever.  No urinary tract infection symptoms.  Normal bowel movements.  Same sex relationship.  Patient is upset this morning because she got the information that her screening mammogram showed calcifications on the left side.  A left diagnostic mammogram and ultrasound are scheduled for tomorrow.  Last year she had a removal of a left breast papilloma.  No first-degree relative with breast cancer.  Previous Paps normal.  Last Pap was Sep 06, 2016- with negative high risk HPV.  Since then, patient had a Left Dx Mammo which was benign.   OB History  No data available    Past medical history,surgical history, problem list, medications, allergies, family history and social history were all reviewed and documented in the EPIC chart.   Directed ROS with pertinent positives and negatives documented in the history of present illness/assessment and plan.  Exam:  There were no vitals filed for this visit. General appearance:  Normal  Pelvic US today: T/V and T/A images.  Uterus anteverted homogeneous measuring 8.54 x 5.12 x 4.47 cm.  Endometrial lining normal at 3.2 mm.  Right ovary with tiny microcalcifications less than 4 mm.  Left ovary normal.  No mass in the right or left adnexa.  No free fluid in the posterior cul-de-sac.  Labs done on Aug 07, 2017: [place] at 17.8, TSH normal at 0.72, prolactin normal at 8.2.  Left diagnostic mammogram  Aug 08, 2017: Benign.  Bilateral screening mammogram recommended in 1 year.   Assessment/Plan:  47 y.o. No obstetric history on file.   1. Irregular menses Reassuring endometrial line thin at 3.2 mm.  Patient reassured.  Decision to observe given that [facility] is 17.8 and TSH and prolactin are normal.  2. Pelvic pain in female Pelvic ultrasound is within normal with no adnexal mass.  Patient reassured.  3. Breast calcification, left Diagnostic mammogram of the left breast was benign on Aug 08, 2017.  Repeat screening mammogram in a year.  Counseling on above issues and coordination of care more than 50% for 15 minutes.  Princess Bruins MD, 11:26 AM 08/26/2017

## 2017-08-31 ENCOUNTER — Encounter: Payer: Self-pay | Admitting: Obstetrics & Gynecology

## 2017-08-31 NOTE — Patient Instructions (Signed)
1. Irregular menses Reassuring endometrial line thin at 3.2 mm.  Patient reassured.  Decision to observe given that [facility] is 17.8 and TSH and prolactin are normal.  2. Pelvic pain in female Pelvic ultrasound is within normal with no adnexal mass.  Patient reassured.  3. Breast calcification, left Diagnostic mammogram of the left breast was benign on Aug 08, 2017.  Repeat screening mammogram in a year.  Tanya Warren, it was a pleasure seeing you today!

## 2017-12-06 ENCOUNTER — Encounter: Payer: 59 | Admitting: Obstetrics & Gynecology

## 2018-05-14 ENCOUNTER — Other Ambulatory Visit: Payer: BC Managed Care – PPO

## 2018-06-04 ENCOUNTER — Encounter: Payer: Self-pay | Admitting: Obstetrics & Gynecology

## 2018-06-04 ENCOUNTER — Ambulatory Visit: Payer: BC Managed Care – PPO | Admitting: Obstetrics & Gynecology

## 2018-06-04 VITALS — BP 133/88 | Ht 66.0 in | Wt 175.2 lb

## 2018-06-04 DIAGNOSIS — E663 Overweight: Secondary | ICD-10-CM | POA: Diagnosis not present

## 2018-06-04 DIAGNOSIS — Z01419 Encounter for gynecological examination (general) (routine) without abnormal findings: Secondary | ICD-10-CM | POA: Diagnosis not present

## 2018-06-04 NOTE — Progress Notes (Signed)
Tanya Warren 1970/08/27 [id number]   History:    48 y.o. G4P2A2L2  Same sex relationship, married.  RP:  Established patient presenting for annual gyn exam   HPI: Normal menstrual periods usually every month, occasionally at 2 months.  Normal menstrual flow.  No breakthrough bleeding.  No pelvic pain.  Urine and bowel movements normal.  Breasts normal.  Body mass index 28.28.  We will follow-up here for fasting health labs.  Past medical history,surgical history, family history and social history were all reviewed and documented in the EPIC chart.  Gynecologic History Patient's last menstrual period was 05/20/2018. Contraception: Same sex relationship Last Pap:  1-2 yrs ago, at [facility], normal per patient.  Will obtain results. Last mammogram: 08/2017.  Results were: Benign Bone Density: Never Colonoscopy: Will schedule at age 21  Obstetric History OB History  Gravida Para Term Preterm AB Living  _0 SAB TAB Ectopic Multiple Live Births  2            # Outcome Date GA Lbr Len/2nd Weight Sex Delivery Anes PTL Lv  4 SAB           3 SAB           2 Term           1 Term              ROS: A ROS was performed and pertinent positives and negatives are included in the history.  GENERAL: No fevers or chills. HEENT: No change in vision, no earache, sore throat or sinus congestion. NECK: No pain or stiffness. CARDIOVASCULAR: No chest pain or pressure. No palpitations. PULMONARY: No shortness of breath, cough or wheeze. GASTROINTESTINAL: No abdominal pain, nausea, vomiting or diarrhea, melena or bright red blood per rectum. GENITOURINARY: No urinary frequency, urgency, hesitancy or dysuria. MUSCULOSKELETAL: No joint or muscle pain, no back pain, no recent trauma. DERMATOLOGIC: No rash, no itching, no lesions. ENDOCRINE: No polyuria, polydipsia, no heat or cold intolerance. No recent change in weight. HEMATOLOGICAL: No anemia or easy bruising or bleeding. NEUROLOGIC: No  headache, seizures, numbness, tingling or weakness. PSYCHIATRIC: No depression, no loss of interest in normal activity or change in sleep pattern.     Exam:   BP 133/88 (BP Location: Right Arm, Patient Position: Sitting, Cuff Size: Large)   Ht 5' 6" (1.676 m)   Wt 175 lb 3.2 oz (79.5 kg)   LMP 05/20/2018   BMI 28.28 kg/m   Body mass index is 28.28 kg/m.  General appearance : Well developed well nourished female. No acute distress HEENT: Eyes: no retinal hemorrhage or exudates,  Neck supple, trachea midline, no carotid bruits, no thyroidmegaly Lungs: Clear to auscultation, no rhonchi or wheezes, or rib retractions  Heart: Regular rate and rhythm, no murmurs or gallops Breast:Examined in sitting and supine position were symmetrical in appearance, no palpable masses or tenderness,  no skin retraction, no nipple inversion, no nipple discharge, no skin discoloration, no axillary or supraclavicular lymphadenopathy Abdomen: no palpable masses or tenderness, no rebound or guarding Extremities: no edema or skin discoloration or tenderness  Pelvic: Vulva: Normal             Vagina: No gross lesions or discharge  Cervix: No gross lesions or discharge  Uterus  AV, normal size, shape and consistency, non-tender and mobile  Adnexa  Without masses or tenderness  Anus: Normal   Assessment/Plan:  48 y.o. female  for annual exam   1. Well female exam with routine gynecological exam Normal gynecologic exam.  Same-sex marriage.  Will obtain Pap test from [place] which was done a year or 2 ago.  Will repeat Pap test at 3 years.  Breast exam normal.  Last screening mammogram May 2019 was benign.  We will follow-up here for fasting health labs.  Will schedule screening colonoscopy at age 39. - CBC; Future - Comp Met (CMET); Future - Lipid panel; Future - TSH; Future - VITAMIN D 25 Hydroxy (Vit-D Deficiency, Fractures); Future  2. Overweight (BMI 25.0-29.9) Recommend a slightly lower  calorie/carb diet such as [employer].  Aerobic physical activities 5 times a week and weightlifting every 2 days.  Other orders - Multiple Vitamin (MULTIVITAMIN) tablet; Take 1 tablet by mouth daily.  Princess Bruins MD, 3:17 PM 06/04/2018

## 2018-06-04 NOTE — Patient Instructions (Addendum)
1. Well female exam with routine gynecological exam Normal gynecologic exam.  Same-sex marriage.  Will obtain Pap test from [place] which was done a year or 2 ago.  Will repeat Pap test at 3 years.  Breast exam normal.  Last screening mammogram May 2019 was benign.  We will follow-up here for fasting health labs.  Will schedule screening colonoscopy at age 48. - CBC; Future - Comp Met (CMET); Future - Lipid panel; Future - TSH; Future - VITAMIN D 25 Hydroxy (Vit-D Deficiency, Fractures); Future  2. Overweight (BMI 25.0-29.9) Recommend a slightly lower calorie/carb diet such as [employer].  Aerobic physical activities 5 times a week and weightlifting every 2 days.  Other orders - Multiple Vitamin (MULTIVITAMIN) tablet; Take 1 tablet by mouth daily.

## 2018-06-11 ENCOUNTER — Other Ambulatory Visit: Payer: BC Managed Care – PPO

## 2018-06-20 ENCOUNTER — Other Ambulatory Visit: Payer: BC Managed Care – PPO

## 2018-06-23 DIAGNOSIS — M25551 Pain in right hip: Secondary | ICD-10-CM | POA: Insufficient documentation

## 2018-10-06 ENCOUNTER — Other Ambulatory Visit: Payer: Self-pay | Admitting: Obstetrics & Gynecology

## 2018-10-06 DIAGNOSIS — Z1231 Encounter for screening mammogram for malignant neoplasm of breast: Secondary | ICD-10-CM

## 2018-11-13 ENCOUNTER — Other Ambulatory Visit: Payer: Self-pay

## 2018-11-13 ENCOUNTER — Ambulatory Visit
Admission: RE | Admit: 2018-11-13 | Discharge: 2018-11-13 | Disposition: A | Discharge status: Home / Self Care | Payer: BC Managed Care – PPO | Source: Ambulatory Visit | Attending: Obstetrics & Gynecology | Admitting: Obstetrics & Gynecology

## 2018-11-13 ENCOUNTER — Other Ambulatory Visit: Payer: BC Managed Care – PPO

## 2018-11-13 DIAGNOSIS — Z1231 Encounter for screening mammogram for malignant neoplasm of breast: Secondary | ICD-10-CM

## 2018-11-13 DIAGNOSIS — Z01419 Encounter for gynecological examination (general) (routine) without abnormal findings: Secondary | ICD-10-CM

## 2018-11-19 ENCOUNTER — Other Ambulatory Visit: Payer: Self-pay | Admitting: *Deleted

## 2018-11-19 ENCOUNTER — Ambulatory Visit: Payer: 59 | Admitting: Family Medicine

## 2018-11-19 DIAGNOSIS — E559 Vitamin D deficiency, unspecified: Secondary | ICD-10-CM

## 2018-11-19 DIAGNOSIS — R748 Abnormal levels of other serum enzymes: Secondary | ICD-10-CM

## 2018-11-19 LAB — HEMOGLOBIN A1C W/OUT EAG: Hgb A1c MFr Bld: 5.1 % of total Hgb (ref <5.7)

## 2018-11-19 LAB — COMPREHENSIVE METABOLIC PANEL
AG Ratio: 1.7 (calc) (ref 1.0–2.5)
ALT: 30 U/L — ABNORMAL HIGH (ref 6–29)
AST: 23 U/L (ref 10–35)
Albumin: 4.3 g/dL (ref 3.6–5.1)
Alkaline phosphatase (APISO): 55 U/L (ref 31–125)
BUN: 12 mg/dL (ref 7–25)
CO2: 26 mmol/L (ref 20–32)
Calcium: 9.3 mg/dL (ref 8.6–10.2)
Chloride: 103 mmol/L (ref 98–110)
Creat: 0.78 mg/dL (ref 0.50–1.10)
Globulin: 2.6 g/dL (calc) (ref 1.9–3.7)
Glucose, Bld: 106 mg/dL — ABNORMAL HIGH (ref 65–99)
Potassium: 3.8 mmol/L (ref 3.5–5.3)
Sodium: 138 mmol/L (ref 135–146)
Total Bilirubin: 0.7 mg/dL (ref 0.2–1.2)
Total Protein: 6.9 g/dL (ref 6.1–8.1)

## 2018-11-19 LAB — VITAMIN D 25 HYDROXY (VIT D DEFICIENCY, FRACTURES): Vit D, 25-Hydroxy: 27 ng/mL — ABNORMAL LOW (ref 30–100)

## 2018-11-19 LAB — CBC
HCT: 44.1 % (ref 35.0–45.0)
Hemoglobin: 14.8 g/dL (ref 11.7–15.5)
MCH: 31.3 pg (ref 27.0–33.0)
MCHC: 33.6 g/dL (ref 32.0–36.0)
MCV: 93.2 fL (ref 80.0–100.0)
MPV: 11 fL (ref 7.5–12.5)
Platelets: 217 10*3/uL (ref 140–400)
RBC: 4.73 10*6/uL (ref 3.80–5.10)
RDW: 12.7 % (ref 11.0–15.0)
WBC: 5.8 10*3/uL (ref 3.8–10.8)

## 2018-11-19 LAB — LIPID PANEL
Cholesterol: 175 mg/dL (ref <200)
HDL: 50 mg/dL (ref >=50)
LDL Cholesterol (Calc): 94 mg/dL (calc)
Non-HDL Cholesterol (Calc): 125 mg/dL (calc) (ref <130)
Total CHOL/HDL Ratio: 3.5 (calc) (ref <5.0)
Triglycerides: 225 mg/dL — ABNORMAL HIGH (ref <150)

## 2018-11-19 LAB — TEST AUTHORIZATION

## 2018-11-19 LAB — TSH: TSH: 1.11 mIU/L

## 2018-11-19 MED ORDER — VITAMIN D (ERGOCALCIFEROL) 1.25 MG (50000 UNIT) PO CAPS: ORAL_CAPSULE | ORAL | 0 refills | Status: DC

## 2018-12-03 ENCOUNTER — Ambulatory Visit (INDEPENDENT_AMBULATORY_CARE_PROVIDER_SITE_OTHER): Payer: BC Managed Care – PPO | Admitting: Family Medicine

## 2018-12-03 ENCOUNTER — Other Ambulatory Visit: Payer: Self-pay

## 2018-12-03 ENCOUNTER — Encounter: Payer: Self-pay | Admitting: Family Medicine

## 2018-12-03 VITALS — BP 160/90 | HR 86 | Temp 98.7°F | Ht 66.0 in | Wt 174.4 lb

## 2018-12-03 DIAGNOSIS — R748 Abnormal levels of other serum enzymes: Secondary | ICD-10-CM | POA: Diagnosis not present

## 2018-12-03 DIAGNOSIS — R030 Elevated blood-pressure reading, without diagnosis of hypertension: Secondary | ICD-10-CM

## 2018-12-03 DIAGNOSIS — Z114 Encounter for screening for human immunodeficiency virus [HIV]: Secondary | ICD-10-CM

## 2018-12-03 DIAGNOSIS — E781 Pure hyperglyceridemia: Secondary | ICD-10-CM | POA: Diagnosis not present

## 2018-12-03 DIAGNOSIS — E559 Vitamin D deficiency, unspecified: Secondary | ICD-10-CM

## 2018-12-03 DIAGNOSIS — Z0000 Encounter for general adult medical examination without abnormal findings: Secondary | ICD-10-CM | POA: Diagnosis not present

## 2018-12-03 NOTE — Patient Instructions (Signed)

## 2018-12-03 NOTE — Progress Notes (Signed)
Patient: Tanya Warren MRN: [id number] DOB: 1970/10/12 PCP: Orma Flaming, MD     Subjective:  Chief Complaint  Patient presents with  . Establish Care  . Annual Exam    HPI: The patient is a 48 y.o. female who presents today for annual exam. She denies any changes to past medical history. There have been no recent hospitalizations. They are following a well balanced diet and exercise plan. Weight has been stable. No complaints today. Hx of white coat hypertension.   NO family history of colon cancer or breast cancer in first degree relative.  Hx of CAD/MI and diabetes in her father. Mother alcohol abuse.   Going through perimenopause. No birth control. Marriage with woman.  White coat HTN: home readings 120/80s.   Immunization History  Administered Date(s) Administered  . Rabies, IM 10/09/2011, 10/12/2011  . Rabies, intradermal 10/09/2011  . Tdap 11/17/2010   Colonoscopy: age 62 years  Mammogram: 11/13/18. Wnl.  Pap smear: 1-2 years ago at [facility]. Had well woman with dr. Dellis Filbert  TdaP: 11/17/2010  Review of Systems  Constitutional: Negative for appetite change, chills, fatigue and fever.  HENT: Negative for congestion, dental problem, ear pain, hearing loss, nosebleeds, postnasal drip, sore throat and trouble swallowing.   Respiratory: Negative for cough, chest tightness and shortness of breath.   Cardiovascular: Negative for chest pain, palpitations and leg swelling.  Gastrointestinal: Negative for abdominal pain, blood in stool, diarrhea, nausea and vomiting.  Endocrine: Negative for cold intolerance, polydipsia, polyphagia and polyuria.  Genitourinary: Negative for dysuria, frequency, hematuria and urgency.  Musculoskeletal: Negative for arthralgias, back pain, myalgias and neck pain.  Skin: Negative for rash.  Neurological: Negative for dizziness and headaches.  Psychiatric/Behavioral: Positive for sleep disturbance. Negative for dysphoric mood. The patient is  nervous/anxious.     Allergies Patient is allergic to cinnamon; penicillins; allantoin-pramoxine; and neosporin [neomycin-bacitracin zn-polymyx].  Past Medical History Patient  has a past medical history of Headache and Papilloma of left breast.  Surgical History Patient  has a past surgical history that includes Dilation and curettage of uterus; Breast lumpectomy with radioactive seed localization (Left, 08/30/2016); and Breast excisional biopsy (Left, 08/2016).  Family History Pateint's family history is not on file.  Social History Patient  reports that she has never smoked. She has never used smokeless tobacco. She reports current alcohol use. She reports that she does not use drugs.    Objective: Vitals:   12/03/18 1305 12/03/18 1340  BP: 122/90 (!) 160/90  Pulse: (!) 118 86  Temp: 98.7 F (37.1 C)   TempSrc: Skin   SpO2: 99%   Weight: 174 lb 6.4 oz (79.1 kg)   Height: 5\' 6"  (1.676 m)     Body mass index is 28.15 kg/m.  Physical Exam Vitals signs reviewed.  Constitutional:      Appearance: Normal appearance. She is well-developed. She is obese.  HENT:     Head: Normocephalic and atraumatic.     Right Ear: Tympanic membrane, ear canal and external ear normal.     Left Ear: Tympanic membrane, ear canal and external ear normal.     Nose: Nose normal.     Mouth/Throat:     Mouth: Mucous membranes are moist.  Eyes:     Extraocular Movements: Extraocular movements intact.     Conjunctiva/sclera: Conjunctivae normal.     Pupils: Pupils are equal, round, and reactive to light.  Neck:     Musculoskeletal: Normal range of motion and neck supple.  Thyroid: No thyromegaly.     Comments: No thyromegaly  Cardiovascular:     Rate and Rhythm: Normal rate and regular rhythm.     Pulses: Normal pulses.     Heart sounds: Normal heart sounds. No murmur.  Pulmonary:     Effort: Pulmonary effort is normal.     Breath sounds: Normal breath sounds.  Abdominal:     General:  Abdomen is flat. Bowel sounds are normal. There is no distension.     Palpations: Abdomen is soft.     Tenderness: There is no abdominal tenderness.  Lymphadenopathy:     Cervical: No cervical adenopathy.  Skin:    General: Skin is warm and dry.     Findings: No rash.  Neurological:     General: No focal deficit present.     Mental Status: She is alert and oriented to person, place, and time.     Cranial Nerves: No cranial nerve deficit.     Coordination: Coordination normal.     Deep Tendon Reflexes: Reflexes normal.  Psychiatric:        Behavior: Behavior normal.    Depression screen [facility] 2/9 12/03/2018  Decreased Interest 0  Down, Depressed, Hopeless 0  PHQ - 2 Score 0       Assessment/plan: 1. Annual physical exam Routine lab work has already been done with her gyn. Reviewed with her today. Will future order labs in 6 months to have her work on triglycerides. hiv screen added at that time. Requesting records for her pap smear, otherwise utd on HM. Declines flu shot. Future order HIV screen. Continue healthy diet/exercise. F/u in one year or as needed.  Patient counseling [x]    Nutrition: Stressed importance of moderation in sodium/caffeine intake, saturated fat and cholesterol, caloric balance, sufficient intake of fresh fruits, vegetables, fiber, calcium, iron, and 1 mg of folate supplement per day (for females capable of pregnancy).  [x]    Stressed the importance of regular exercise.   []    Substance Abuse: Discussed cessation/primary prevention of tobacco, alcohol, or other drug use; driving or other dangerous activities under the influence; availability of treatment for abuse.   [x]    Injury prevention: Discussed safety belts, safety helmets, smoke detector, smoking near bedding or upholstery.   [x]    Sexuality: Discussed sexually transmitted diseases, partner selection, use of condoms, avoidance of unintended pregnancy  and contraceptive alternatives.  [x]    Dental health:  Discussed importance of regular tooth brushing, flossing, and dental visits.  [x]    Health maintenance and immunizations reviewed. Please refer to Health maintenance section.    2. Vitamin D deficiency  - VITAMIN D 25 Hydroxy (Vit-D Deficiency, Fractures); Future  3. Hypertriglyceridemia  - Lipid panel; Future  4. Elevated liver enzymes  - Comprehensive metabolic panel; Future  5. Elevated blood pressure without diagnosis of HTN -asked her to keep a log at home. Will come back in one month with her cuff for recheck and to make sure her home cuff is calibrated correctly. DASH diet given. Continue to work on diet and weight loss. Home readings today have been to goal. Discussed risks of uncontrolled blood pressure e.g. afib/chf/renal issues so I want to make sure she is truly to goal when not at doctors office.   Return in about 1 month (around 01/03/2019) for nurse visit for BP/bring blood pressure cuff .  email after 48th bday for cscope referral  Orma Flaming, MD [place]  12/03/2018

## 2019-01-22 LAB — SURGICAL PATHOLOGY

## 2019-01-22 LAB — HPV APTIMA: .: NEGATIVE

## 2019-01-29 ENCOUNTER — Encounter: Payer: Self-pay | Admitting: Family Medicine

## 2019-01-29 DIAGNOSIS — D242 Benign neoplasm of left breast: Secondary | ICD-10-CM | POA: Insufficient documentation

## 2019-02-23 ENCOUNTER — Other Ambulatory Visit: Payer: Self-pay | Admitting: *Deleted

## 2019-02-23 DIAGNOSIS — Z20822 Contact with and (suspected) exposure to covid-19: Secondary | ICD-10-CM

## 2019-02-25 LAB — NOVEL CORONAVIRUS, NAA: SARS-CoV-2, NAA: NOT DETECTED

## 2019-11-04 ENCOUNTER — Other Ambulatory Visit: Payer: Self-pay | Admitting: Obstetrics & Gynecology

## 2019-11-04 DIAGNOSIS — Z1231 Encounter for screening mammogram for malignant neoplasm of breast: Secondary | ICD-10-CM

## 2019-11-24 ENCOUNTER — Ambulatory Visit
Admission: RE | Admit: 2019-11-24 | Discharge: 2019-11-24 | Disposition: A | Discharge status: Home / Self Care | Payer: BC Managed Care – PPO | Source: Ambulatory Visit | Attending: Obstetrics & Gynecology | Admitting: Obstetrics & Gynecology

## 2019-11-24 ENCOUNTER — Other Ambulatory Visit: Payer: Self-pay

## 2019-11-24 DIAGNOSIS — Z1231 Encounter for screening mammogram for malignant neoplasm of breast: Secondary | ICD-10-CM

## 2020-06-02 ENCOUNTER — Encounter: Payer: Self-pay | Admitting: Gastroenterology

## 2020-07-12 ENCOUNTER — Other Ambulatory Visit: Payer: Self-pay

## 2020-07-12 ENCOUNTER — Ambulatory Visit (AMBULATORY_SURGERY_CENTER): Payer: Self-pay

## 2020-07-12 VITALS — Ht 66.0 in | Wt 185.0 lb

## 2020-07-12 DIAGNOSIS — Z1211 Encounter for screening for malignant neoplasm of colon: Secondary | ICD-10-CM

## 2020-07-12 MED ORDER — SUTAB 1479-225-188 MG PO TABS: 12.0000 | ORAL_TABLET | ORAL | 0 refills | Status: DC

## 2020-07-12 NOTE — Progress Notes (Signed)
No allergies to soy or egg Pt is not on blood thinners or diet pills Denies issues with sedation/intubation Denies atrial flutter/fib Denies constipation   Pt is aware of Covid safety and care partner requirements.      

## 2020-07-13 ENCOUNTER — Encounter: Payer: Self-pay | Admitting: Gastroenterology

## 2020-08-02 ENCOUNTER — Other Ambulatory Visit: Payer: Self-pay

## 2020-08-02 ENCOUNTER — Encounter: Payer: Self-pay | Admitting: Gastroenterology

## 2020-08-02 ENCOUNTER — Other Ambulatory Visit: Payer: Self-pay | Admitting: Gastroenterology

## 2020-08-02 ENCOUNTER — Ambulatory Visit (AMBULATORY_SURGERY_CENTER): Payer: BC Managed Care – PPO | Admitting: Gastroenterology

## 2020-08-02 VITALS — BP 120/77 | HR 73 | Temp 96.5°F | Resp 22 | Ht 66.0 in | Wt 185.0 lb

## 2020-08-02 DIAGNOSIS — D123 Benign neoplasm of transverse colon: Secondary | ICD-10-CM

## 2020-08-02 DIAGNOSIS — D120 Benign neoplasm of cecum: Secondary | ICD-10-CM

## 2020-08-02 DIAGNOSIS — Z1211 Encounter for screening for malignant neoplasm of colon: Secondary | ICD-10-CM | POA: Diagnosis not present

## 2020-08-02 MED ORDER — SODIUM CHLORIDE 0.9 % IV SOLN: 500.0000 mL | INTRAVENOUS | Status: DC

## 2020-08-02 NOTE — Op Note (Signed)
[facility] Patient Name: Tanya Warren Procedure Date: 08/02/2020 8:08 AM MRN: [id number] Endoscopist: Doristine Locks , MD Age: 50 Referring MD:  Date of Birth: 1970-09-21 Gender: Female Account #: [id number] Procedure:                Colonoscopy Indications:              Screening for colorectal malignant neoplasm, This                            is the patient's first colonoscopy Medicines:                Monitored Anesthesia Care Procedure:                Pre-Anesthesia Assessment:                           - Prior to the procedure, a History and Physical                            was performed, and patient medications and                            allergies were reviewed. The patient's tolerance of                            previous anesthesia was also reviewed. The risks                            and benefits of the procedure and the sedation                            options and risks were discussed with the patient.                            All questions were answered, and informed consent                            was obtained. Prior Anticoagulants: The patient has                            taken no previous anticoagulant or antiplatelet                            agents. ASA Grade Assessment: II - A patient with                            mild systemic disease. After reviewing the risks                            and benefits, the patient was deemed in                            satisfactory condition to undergo the procedure.  After obtaining informed consent, the colonoscope                            was passed under direct vision. Throughout the                            procedure, the patient's blood pressure, pulse, and                            oxygen saturations were monitored continuously. The                            Olympus CF-HQ190L (Serial# [id number]) Colonoscope was                            introduced through the anus  and advanced to the the                            terminal ileum. The colonoscopy was performed                            without difficulty. The patient tolerated the                            procedure well. The quality of the bowel                            preparation was good. The terminal ileum, ileocecal                            valve, appendiceal orifice, and rectum were                            photographed. Scope In: 8:14:47 AM Scope Out: 8:32:28 AM Scope Withdrawal Time: 0 hours 15 minutes 3 seconds  Total Procedure Duration: 0 hours 17 minutes 41 seconds  Findings:                 The perianal and digital rectal examinations were                            normal.                           Two sessile polyps were found in the hepatic                            flexure and cecum. The polyps were 3 to 5 mm in                            size. These polyps were removed with a cold snare.                            Resection and retrieval were complete. Estimated  blood loss was minimal.                           The exam was otherwise normal throughout the                            remainder of the colon.                           The retroflexed view of the distal rectum and anal                            verge was normal and showed no anal or rectal                            abnormalities.                           The terminal ileum appeared normal. Complications:            No immediate complications. Estimated Blood Loss:     Estimated blood loss was minimal. Impression:               - Two 3 to 5 mm polyps at the hepatic flexure and                            in the cecum, removed with a cold snare. Resected                            and retrieved.                           - The distal rectum and anal verge are normal on                            retroflexion view.                           - The examined portion of the ileum was  normal. Recommendation:           - Patient has a contact number available for                            emergencies. The signs and symptoms of potential                            delayed complications were discussed with the                            patient. Return to normal activities tomorrow.                            Written discharge instructions were provided to the                            patient.                           -  Resume previous diet.                           - Continue present medications.                           - Await pathology results.                           - Repeat colonoscopy for surveillance based on                            pathology results.                           - Return to GI office PRN. Gerrit Heck, MD 08/02/2020 8:36:15 AM

## 2020-08-02 NOTE — Progress Notes (Signed)
Pt's states no medical or surgical changes since previsit or office visit. 

## 2020-08-02 NOTE — Progress Notes (Signed)
Called to room to assist during endoscopic procedure.  Patient ID and intended procedure confirmed with present staff. Received instructions for my participation in the procedure from the performing physician.  

## 2020-08-02 NOTE — Patient Instructions (Signed)
Handout given for polyps.  YOU HAD AN ENDOSCOPIC PROCEDURE TODAY AT THE [facility]:   Refer to the procedure report that was given to you for any specific questions about what was found during the examination.  If the procedure report does not answer your questions, please call your gastroenterologist to clarify.  If you requested that your care partner not be given the details of your procedure findings, then the procedure report has been included in a sealed envelope for you to review at your convenience later.  YOU SHOULD EXPECT: Some feelings of bloating in the abdomen. Passage of more gas than usual.  Walking can help get rid of the air that was put into your GI tract during the procedure and reduce the bloating. If you had a lower endoscopy (such as a colonoscopy or flexible sigmoidoscopy) you may notice spotting of blood in your stool or on the toilet paper. If you underwent a bowel prep for your procedure, you may not have a normal bowel movement for a few days.  Please Note:  You might notice some irritation and congestion in your nose or some drainage.  This is from the oxygen used during your procedure.  There is no need for concern and it should clear up in a day or so.  SYMPTOMS TO REPORT IMMEDIATELY:   Following lower endoscopy (colonoscopy or flexible sigmoidoscopy):  Excessive amounts of blood in the stool  Significant tenderness or worsening of abdominal pains  Swelling of the abdomen that is new, acute  Fever of 100F or higher  For urgent or emergent issues, a gastroenterologist can be reached at any hour by calling [phone]. Do not use MyChart messaging for urgent concerns.    DIET:  We do recommend a small meal at first, but then you may proceed to your regular diet.  Drink plenty of fluids but you should avoid alcoholic beverages for 24 hours.  ACTIVITY:  You should plan to take it easy for the rest of today and you should NOT DRIVE or use heavy  machinery until tomorrow (because of the sedation medicines used during the test).    FOLLOW UP: Our staff will call the number listed on your records 48-72 hours following your procedure to check on you and address any questions or concerns that you may have regarding the information given to you following your procedure. If we do not reach you, we will leave a message.  We will attempt to reach you two times.  During this call, we will ask if you have developed any symptoms of COVID 19. If you develop any symptoms (ie: fever, flu-like symptoms, shortness of breath, cough etc.) before then, please call [phone].  If you test positive for Covid 19 in the 2 weeks post procedure, please call and report this information to us.    If any biopsies were taken you will be contacted by phone or by letter within the next 1-3 weeks.  Please call us at [phone] if you have not heard about the biopsies in 3 weeks.    SIGNATURES/CONFIDENTIALITY: You and/or your care partner have signed paperwork which will be entered into your electronic medical record.  These signatures attest to the fact that that the information above on your After Visit Summary has been reviewed and is understood.  Full responsibility of the confidentiality of this discharge information lies with you and/or your care-partner. 

## 2020-08-02 NOTE — Progress Notes (Signed)
pt tolerated well. VSS. awake and to recovery. Report given to RN.  

## 2020-08-04 ENCOUNTER — Telehealth: Payer: Self-pay | Admitting: *Deleted

## 2020-08-04 ENCOUNTER — Telehealth: Payer: Self-pay

## 2020-08-04 NOTE — Telephone Encounter (Signed)
  Follow up Call-  Call back number 08/02/2020  Post procedure Call Back phone  # [phone]  Permission to leave phone message Yes  Some recent data might be hidden     Patient questions:  Do you have a fever, pain , or abdominal swelling? No. Pain Score  0 *  Have you tolerated food without any problems? Yes.    Have you been able to return to your normal activities? Yes.    Do you have any questions about your discharge instructions: Diet   No. Medications  No. Follow up visit  No.  Do you have questions or concerns about your Care? No.  Actions: * If pain score is 4 or above: No action needed, pain <4

## 2020-08-04 NOTE — Telephone Encounter (Signed)
NO ANSWER, MESSAGE LEFT FOR PATIENT. 

## 2020-08-18 ENCOUNTER — Encounter: Payer: Self-pay | Admitting: Gastroenterology

## 2020-09-30 ENCOUNTER — Ambulatory Visit: Payer: BC Managed Care – PPO | Admitting: Obstetrics & Gynecology

## 2020-10-24 ENCOUNTER — Other Ambulatory Visit: Payer: Self-pay | Admitting: Obstetrics & Gynecology

## 2020-10-24 DIAGNOSIS — Z1231 Encounter for screening mammogram for malignant neoplasm of breast: Secondary | ICD-10-CM

## 2020-12-01 ENCOUNTER — Ambulatory Visit
Admission: RE | Admit: 2020-12-01 | Discharge: 2020-12-01 | Disposition: A | Discharge status: Home / Self Care | Payer: BC Managed Care – PPO | Source: Ambulatory Visit | Attending: Obstetrics & Gynecology | Admitting: Obstetrics & Gynecology

## 2020-12-01 ENCOUNTER — Other Ambulatory Visit (HOSPITAL_COMMUNITY)
Admission: RE | Admit: 2020-12-01 | Discharge: 2020-12-01 | Disposition: A | Discharge status: Home / Self Care | Payer: BC Managed Care – PPO | Source: Ambulatory Visit | Attending: Obstetrics & Gynecology | Admitting: Obstetrics & Gynecology

## 2020-12-01 ENCOUNTER — Encounter: Payer: Self-pay | Admitting: Obstetrics & Gynecology

## 2020-12-01 ENCOUNTER — Ambulatory Visit (INDEPENDENT_AMBULATORY_CARE_PROVIDER_SITE_OTHER): Payer: BC Managed Care – PPO | Admitting: Obstetrics & Gynecology

## 2020-12-01 ENCOUNTER — Other Ambulatory Visit: Payer: Self-pay

## 2020-12-01 VITALS — BP 120/80 | HR 97 | Resp 16 | Ht 65.75 in | Wt 166.0 lb

## 2020-12-01 DIAGNOSIS — Z01419 Encounter for gynecological examination (general) (routine) without abnormal findings: Secondary | ICD-10-CM | POA: Insufficient documentation

## 2020-12-01 DIAGNOSIS — N951 Menopausal and female climacteric states: Secondary | ICD-10-CM

## 2020-12-01 DIAGNOSIS — Z1231 Encounter for screening mammogram for malignant neoplasm of breast: Secondary | ICD-10-CM

## 2020-12-01 DIAGNOSIS — E663 Overweight: Secondary | ICD-10-CM

## 2020-12-01 NOTE — Progress Notes (Signed)
Tanya Warren 03-16-1971 [id number]   History:    50 y.o. G4P2A2L2  Same sex relationship, married.   RP:  Established patient presenting for annual gyn exam    HPI: Perimenopause with 2 light menstrual periods this year, last one in the spring, normal flow.  No breakthrough bleeding.  Very upbeat with no hot flushes or night sweat.  No pelvic pain.  Urine and bowel movements normal.  Breasts normal.  Body mass index decreased to 27.  Feeling great on a low carb diet.  Walking regularly.  [facility] here today.    Past medical history,surgical history, family history and social history were all reviewed and documented in the EPIC chart.  Gynecologic History No LMP recorded. (Menstrual status: Perimenopausal).  Obstetric History OB History  Gravida Para Term Preterm AB Living  4 2 2   2 2   SAB IAB Ectopic Multiple Live Births  2            # Outcome Date GA Lbr Len/2nd Weight Sex Delivery Anes PTL Lv  4 SAB           3 SAB           2 Term           1 Term              ROS: A ROS was performed and pertinent positives and negatives are included in the history.  GENERAL: No fevers or chills. HEENT: No change in vision, no earache, sore throat or sinus congestion. NECK: No pain or stiffness. CARDIOVASCULAR: No chest pain or pressure. No palpitations. PULMONARY: No shortness of breath, cough or wheeze. GASTROINTESTINAL: No abdominal pain, nausea, vomiting or diarrhea, melena or bright red blood per rectum. GENITOURINARY: No urinary frequency, urgency, hesitancy or dysuria. MUSCULOSKELETAL: No joint or muscle pain, no back pain, no recent trauma. DERMATOLOGIC: No rash, no itching, no lesions. ENDOCRINE: No polyuria, polydipsia, no heat or cold intolerance. No recent change in weight. HEMATOLOGICAL: No anemia or easy bruising or bleeding. NEUROLOGIC: No headache, seizures, numbness, tingling or weakness. PSYCHIATRIC: No depression, no loss of interest in normal activity or change  in sleep pattern.     Exam:   BP 120/80   Pulse 97   Resp 16   Ht 5' 5.75" (1.67 m)   Wt 166 lb (75.3 kg)   BMI 27.00 kg/m   Body mass index is 27 kg/m.  General appearance : Well developed well nourished female. No acute distress HEENT: Eyes: no retinal hemorrhage or exudates,  Neck supple, trachea midline, no carotid bruits, no thyroidmegaly Lungs: Clear to auscultation, no rhonchi or wheezes, or rib retractions  Heart: Regular rate and rhythm, no murmurs or gallops Breast:Examined in sitting and supine position were symmetrical in appearance, no palpable masses or tenderness,  no skin retraction, no nipple inversion, no nipple discharge, no skin discoloration, no axillary or supraclavicular lymphadenopathy Abdomen: no palpable masses or tenderness, no rebound or guarding Extremities: no edema or skin discoloration or tenderness  Pelvic: Vulva: Normal             Vagina: No gross lesions or discharge  Cervix: No gross lesions or discharge.  Pap reflex done.  Uterus  AV, normal size, shape and consistency, non-tender and mobile  Adnexa  Without masses or tenderness  Anus: Normal   Assessment/Plan:  50 y.o. female for annual exam   1. Encounter for routine gynecological examination with Papanicolaou smear of  cervix Normal gynecologic exam.  Pap reflex done.  Breast exam normal.  Screening mammogram done today.  Fasting health labs here today. - CBC - Comp Met (CMET) - Lipid Profile - TSH - Vitamin D 1,25 dihydroxy - Cytology - PAP( [facility])  2. Perimenopause Very smooth progression into perimenopause.  No abnormal bleeding.  We will continue to observe.  3. Overweight (BMI 25.0-29.9)  Continue on low-carb low calorie diet.  Very successful on it with 20 pound weight loss so far.  Walking regularly.  Princess Bruins MD, 3:33 PM 12/01/2020

## 2020-12-02 ENCOUNTER — Encounter: Payer: Self-pay | Admitting: *Deleted

## 2020-12-02 LAB — CYTOLOGY - PAP: Diagnosis: NEGATIVE

## 2020-12-05 LAB — CBC
HCT: 44.1 % (ref 35.0–45.0)
Hemoglobin: 14.6 g/dL (ref 11.7–15.5)
MCH: 30.3 pg (ref 27.0–33.0)
MCHC: 33.1 g/dL (ref 32.0–36.0)
MCV: 91.5 fL (ref 80.0–100.0)
MPV: 11.5 fL (ref 7.5–12.5)
Platelets: 230 10*3/uL (ref 140–400)
RBC: 4.82 10*6/uL (ref 3.80–5.10)
RDW: 12.8 % (ref 11.0–15.0)
WBC: 6.3 10*3/uL (ref 3.8–10.8)

## 2020-12-05 LAB — COMPREHENSIVE METABOLIC PANEL
AG Ratio: 1.6 (calc) (ref 1.0–2.5)
ALT: 27 U/L (ref 6–29)
AST: 20 U/L (ref 10–35)
Albumin: 4.5 g/dL (ref 3.6–5.1)
Alkaline phosphatase (APISO): 56 U/L (ref 37–153)
BUN: 14 mg/dL (ref 7–25)
CO2: 25 mmol/L (ref 20–32)
Calcium: 9.5 mg/dL (ref 8.6–10.4)
Chloride: 101 mmol/L (ref 98–110)
Creat: 0.75 mg/dL (ref 0.50–1.03)
Globulin: 2.8 g/dL (calc) (ref 1.9–3.7)
Glucose, Bld: 86 mg/dL (ref 65–99)
Potassium: 3.9 mmol/L (ref 3.5–5.3)
Sodium: 137 mmol/L (ref 135–146)
Total Bilirubin: 0.7 mg/dL (ref 0.2–1.2)
Total Protein: 7.3 g/dL (ref 6.1–8.1)

## 2020-12-05 LAB — VITAMIN D 1,25 DIHYDROXY
Vitamin D 1, 25 (OH)2 Total: 44 pg/mL (ref 18–72)
Vitamin D2 1, 25 (OH)2: <8 pg/mL
Vitamin D3 1, 25 (OH)2: 44 pg/mL

## 2020-12-05 LAB — LIPID PANEL
Cholesterol: 162 mg/dL (ref <200)
HDL: 62 mg/dL (ref >=50)
LDL Cholesterol (Calc): 83 mg/dL (calc)
Non-HDL Cholesterol (Calc): 100 mg/dL (calc) (ref <130)
Total CHOL/HDL Ratio: 2.6 (calc) (ref <5.0)
Triglycerides: 83 mg/dL (ref <150)

## 2020-12-05 LAB — TSH: TSH: 0.59 mIU/L

## 2020-12-07 ENCOUNTER — Other Ambulatory Visit: Payer: Self-pay | Admitting: Obstetrics & Gynecology

## 2020-12-07 DIAGNOSIS — R928 Other abnormal and inconclusive findings on diagnostic imaging of breast: Secondary | ICD-10-CM

## 2020-12-26 ENCOUNTER — Other Ambulatory Visit: Payer: Self-pay

## 2020-12-26 ENCOUNTER — Ambulatory Visit
Admission: RE | Admit: 2020-12-26 | Discharge: 2020-12-26 | Disposition: A | Discharge status: Home / Self Care | Payer: BC Managed Care – PPO | Source: Ambulatory Visit | Attending: Obstetrics & Gynecology | Admitting: Obstetrics & Gynecology

## 2020-12-26 ENCOUNTER — Ambulatory Visit: Payer: BC Managed Care – PPO

## 2020-12-26 DIAGNOSIS — R928 Other abnormal and inconclusive findings on diagnostic imaging of breast: Secondary | ICD-10-CM

## 2020-12-30 ENCOUNTER — Encounter: Payer: Self-pay | Admitting: Family Medicine

## 2020-12-30 ENCOUNTER — Telehealth (INDEPENDENT_AMBULATORY_CARE_PROVIDER_SITE_OTHER): Payer: BC Managed Care – PPO | Admitting: Family Medicine

## 2020-12-30 ENCOUNTER — Other Ambulatory Visit: Payer: Self-pay

## 2020-12-30 VITALS — BP 130/88 | HR 72 | Temp 97.1°F | Ht 65.75 in | Wt 158.0 lb

## 2020-12-30 DIAGNOSIS — U071 COVID-19: Secondary | ICD-10-CM | POA: Diagnosis not present

## 2020-12-30 MED ORDER — GUAIFENESIN-CODEINE 100-10 MG/5ML PO SOLN: 5.0000 mL | Freq: Four times a day (QID) | ORAL | 0 refills | Status: DC | PRN

## 2020-12-30 NOTE — Progress Notes (Signed)
Phone [phone] Virtual visit via Video note   Subjective:  Chief complaint: Chief Complaint  Patient presents with   Covid Positive    Patient took an at home test and it was positive last night.    Headache   Generalized Body Aches   Cough   Sore Throat   Chills    This visit type was conducted due to national recommendations for restrictions regarding the COVID-19 Pandemic (e.g. social distancing).  This format is felt to be most appropriate for this patient at this time balancing risks to patient and risks to population by having him in for in person visit.  No physical exam was performed (except for noted visual exam or audio findings with Telehealth visits).    Our team/I connected with Essie Hart Rahmani at  9:00 AM EDT by a video enabled telemedicine application (doxy.me or caregility through epic) and verified that I am speaking with the correct person using two identifiers.  Location patient: [facility] Location provider: [facility], office Persons participating in the virtual visit:  patient  Our team/I discussed the limitations of evaluation and management by telemedicine and the availability of in person appointments. In light of current covid-19 pandemic, patient also understands that we are trying to protect them by minimizing in office contact if at all possible.  The patient expressed consent for telemedicine visit and agreed to proceed. Patient understands insurance will be billed.   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 120 mL 0   No current facility-administered medications for this visit.     Objective:  BP 130/88   Pulse 72   Temp (!) 97.1 F  (36.2 C) (Temporal)   Ht 5' 5.75" (1.67 m)   Wt 158 lb (71.7 kg)   BMI 25.70 kg/m  self reported vitals Gen: NAD, resting comfortably Lungs: nonlabored, normal respiratory rate  Skin: appears dry, no obvious rash     Assessment and Plan   # Covid 19 positive S:  Patient presents with   Covid Positive    Patient took an at home test and it was positive last night.    Headache   Generalized Body Aches   Cough   Sore Throat   Chills   Symptoms started yesterday. 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 Got back from game last night and was positive.  Likely exposed at school  No fever, cough, congestion, runny nose, shortness of breath, nausea, vomiting, diarrhea, or new loss of taste or smell.    History of walking pneumonia over 10 years ago.   A/P:  Patient with testing confirming covid 19 with first day of covid 19 symptoms 12/29/20 Vaccination status: all 3 covid shots but no omicron booster  Therefore: - recommended patient watch closely for shortness of breath or confusion or worsening symptoms and if those occur patient should contact us immediately or seek care in the emergency department -recommended patient consider purchasing pulse oximeter and if levels 94% or below persistently- seek care at the hospital - Patient needs to self isolate  for at least 5 days since  first symptom AND at least 24 hours fever free without fever reducing medications AND have improvement in respiratory symptoms . After 5 days can end self isolation but still needs to wear mask for additional 5 days .  - work note written (see letter) -Patient should inform close contacts about exposure (anyone patient been around unmasked for more than 15 minutes)  -discussed importance of staying well hydrated -Discussed ibuprofen 400 mg for headache-if not effective after 8 hours can try a 600 mg dose - Patient has had  good success with codeine cough syrup in the past and specifically request this due to good response and to see if this will help her rest-poor sleep due to cough was tough on her last evening.  I prescribed this but recommended no driving for 8 hours after taking.  If High risk for complications (age >82)-CM discussed outpatient therapeutic options including paxlovid (and risk of rebound), molnupiravir, MAB infusion - patient opted out for now- knows we can rx through Monday or Tuesday if changes mind. Outside of age over 63 low risk thankfully plus vaccinated   Recommended follow up: as needed for new or worsening symptoms No future appointments.  Lab/Order associations: No diagnosis found.  Meds ordered this encounter  Medications   guaiFENesin-codeine 100-10 MG/5ML syrup    Sig: Take 5 mLs by mouth every 6 (six) hours as needed for cough (do not drive for 8 hours after taking.).    Dispense:  120 mL    Refill:  0    Time Spent: 20 minutes of total time (9:04 AM- 9:24 AM) was spent on the date of the encounter performing the following actions: chart review prior to seeing the patient, obtaining history, performing a medically necessary exam, counseling on the treatment plan, discussing risks of codeine which she requested for cough- thankfully has tolerated well in past, placing orders, and documenting in our EHR.   Return precautions advised.  Garret Reddish, MD

## 2021-01-01 ENCOUNTER — Encounter: Payer: Self-pay | Admitting: Family Medicine

## 2021-01-02 ENCOUNTER — Telehealth: Payer: Self-pay

## 2021-01-02 MED ORDER — AZITHROMYCIN 250 MG PO TABS: ORAL_TABLET | ORAL | 0 refills | Status: DC

## 2021-01-02 NOTE — Telephone Encounter (Signed)
Patient seen Dr.Hunter on 9/23  Nurse Assessment Nurse: Delphina Cahill, RN, Santiago Glad Date/Time Eilene Ghazi Time): 12/31/2020 8:14:46 AM Confirm and document reason for call. If symptomatic, describe symptoms. ---Caller states her wife is experiencing covid with throat pain and difficulty swallowing. SaO2=98. Does the patient have any new or worsening symptoms? ---Yes Will a triage be completed? ---Yes Related visit to physician within the last 2 weeks? ---Yes Does the PT have any chronic conditions? (i.e. diabetes, asthma, this includes High risk factors for pregnancy, etc.) ---No Is the patient pregnant or possibly pregnant? (Ask all females between the ages of 65-55) ---No Is this a behavioral health or substance abuse call? ---No Guidelines Guideline Title Affirmed Question Affirmed Notes Nurse Date/Time (Eastern Time) Sore Throat [1] Drooling or spitting out saliva (because can't swallow) AND [2] normal breathing Delphina Cahill, RN, Santiago Glad 12/31/2020 8:18:51 AM Disp. Time Eilene Ghazi Time) Disposition Final User PLEASE NOTE: All timestamps contained within this report are represented as [place] Standard Time. CONFIDENTIALTY NOTICE: This fax transmission is intended only for the addressee. It contains information that is legally privileged, confidential or otherwise protected from use or disclosure. If you are not the intended recipient, you are strictly prohibited from reviewing, disclosing, copying using or disseminating any of this information or taking any action in reliance on or regarding this information. If you have received this fax in error, please notify us immediately by telephone so that we can arrange for its return to [place]. Phone: [phone], Toll-Free: [phone], Fax: [phone] Page: 2 of 2 Call Id: [id number] 12/31/2020 8:22:02 AM Go to ED Now Yes Delphina Cahill, RN, York Pellant Disagree/Comply Comply Caller Understands Yes PreDisposition Did not know what to do Care Advice Given Per  Guideline GO TO ED NOW: * You need to be seen in the Emergency Department. * Go to the ED at ___________ [place] now. Drive carefully. CARE ADVICE given per Sore Throat (Adult) guideline. BRING MEDICINES: * Bring a list of your current medicines when you go to the Emergency Department (ER). * Bring the pill bottles too. This will help the doctor (or NP/PA) to make certain you are taking the right medicines and the right dose. Referrals GO TO FACILITY UNDECIDED

## 2021-01-13 ENCOUNTER — Telehealth: Payer: BC Managed Care – PPO | Admitting: Family Medicine

## 2021-04-17 ENCOUNTER — Emergency Department (HOSPITAL_COMMUNITY): Payer: BC Managed Care – PPO

## 2021-04-17 ENCOUNTER — Other Ambulatory Visit: Payer: Self-pay

## 2021-04-17 ENCOUNTER — Emergency Department (HOSPITAL_COMMUNITY)
Admission: EM | Admit: 2021-04-17 | Discharge: 2021-04-17 | Disposition: A | Discharge status: Home / Self Care | Payer: BC Managed Care – PPO | Attending: Emergency Medicine | Admitting: Emergency Medicine

## 2021-04-17 ENCOUNTER — Encounter (HOSPITAL_COMMUNITY): Payer: Self-pay | Admitting: Emergency Medicine

## 2021-04-17 DIAGNOSIS — R102 Pelvic and perineal pain: Secondary | ICD-10-CM | POA: Diagnosis not present

## 2021-04-17 DIAGNOSIS — R1031 Right lower quadrant pain: Secondary | ICD-10-CM | POA: Diagnosis present

## 2021-04-17 DIAGNOSIS — R110 Nausea: Secondary | ICD-10-CM | POA: Insufficient documentation

## 2021-04-17 DIAGNOSIS — Z20822 Contact with and (suspected) exposure to covid-19: Secondary | ICD-10-CM | POA: Diagnosis not present

## 2021-04-17 LAB — URINALYSIS, ROUTINE W REFLEX MICROSCOPIC
Bacteria, UA: NONE SEEN
Bilirubin Urine: NEGATIVE
Glucose, UA: NEGATIVE mg/dL
Hgb urine dipstick: NEGATIVE
Ketones, ur: NEGATIVE mg/dL
Leukocytes,Ua: NEGATIVE
Nitrite: NEGATIVE
Protein, ur: NEGATIVE mg/dL
Specific Gravity, Urine: 1.006 (ref 1.005–1.030)
pH: 5 (ref 5.0–8.0)

## 2021-04-17 LAB — LACTIC ACID, PLASMA: Lactic Acid, Venous: 1.6 mmol/L (ref 0.5–1.9)

## 2021-04-17 LAB — COMPREHENSIVE METABOLIC PANEL
ALT: 17 U/L (ref 0–44)
AST: 22 U/L (ref 15–41)
Albumin: 4 g/dL (ref 3.5–5.0)
Alkaline Phosphatase: 71 U/L (ref 38–126)
Anion gap: 5 (ref 5–15)
BUN: 12 mg/dL (ref 6–20)
CO2: 25 mmol/L (ref 22–32)
Calcium: 8.3 mg/dL — ABNORMAL LOW (ref 8.9–10.3)
Chloride: 110 mmol/L (ref 98–111)
Creatinine, Ser: 0.81 mg/dL (ref 0.44–1.00)
GFR, Estimated: >60 mL/min (ref >60)
Glucose, Bld: 114 mg/dL — ABNORMAL HIGH (ref 70–99)
Potassium: 3.9 mmol/L (ref 3.5–5.1)
Sodium: 140 mmol/L (ref 135–145)
Total Bilirubin: 1 mg/dL (ref 0.3–1.2)
Total Protein: 7.3 g/dL (ref 6.5–8.1)

## 2021-04-17 LAB — CBC WITH DIFFERENTIAL/PLATELET
Abs Immature Granulocytes: 0.07 10*3/uL (ref 0.00–0.07)
Basophils Absolute: 0.1 10*3/uL (ref 0.0–0.1)
Basophils Relative: 1 %
Eosinophils Absolute: 0.2 10*3/uL (ref 0.0–0.5)
Eosinophils Relative: 3 %
HCT: 40.5 % (ref 36.0–46.0)
Hemoglobin: 13.5 g/dL (ref 12.0–15.0)
Immature Granulocytes: 1 %
Lymphocytes Relative: 55 %
Lymphs Abs: 3.8 10*3/uL (ref 0.7–4.0)
MCH: 30.8 pg (ref 26.0–34.0)
MCHC: 33.3 g/dL (ref 30.0–36.0)
MCV: 92.5 fL (ref 80.0–100.0)
Monocytes Absolute: 0.6 10*3/uL (ref 0.1–1.0)
Monocytes Relative: 9 %
Neutro Abs: 2.1 10*3/uL (ref 1.7–7.7)
Neutrophils Relative %: 31 %
Platelets: 203 10*3/uL (ref 150–400)
RBC: 4.38 MIL/uL (ref 3.87–5.11)
RDW: 12.9 % (ref 11.5–15.5)
WBC: 7 10*3/uL (ref 4.0–10.5)
nRBC: 0 % (ref 0.0–0.2)

## 2021-04-17 LAB — RESP PANEL BY RT-PCR (FLU A&B, COVID) ARPGX2
Influenza A by PCR: NEGATIVE
Influenza B by PCR: NEGATIVE
SARS Coronavirus 2 by RT PCR: NEGATIVE

## 2021-04-17 LAB — LIPASE, BLOOD: Lipase: 44 U/L (ref 11–51)

## 2021-04-17 LAB — I-STAT BETA HCG BLOOD, ED (MC, WL, AP ONLY): I-stat hCG, quantitative: <5 m[IU]/mL (ref <5)

## 2021-04-17 MED ORDER — DICYCLOMINE HCL 10 MG PO CAPS
10.0000 mg | ORAL_CAPSULE | Freq: Once | ORAL | Status: AC
Start: 2021-04-17 — End: 2021-04-17
  Administered 2021-04-17: 10 mg via ORAL
  Filled 2021-04-17: qty 1

## 2021-04-17 MED ORDER — SODIUM CHLORIDE 0.9 % IV SOLN: INTRAVENOUS | Status: DC

## 2021-04-17 MED ORDER — HYDROMORPHONE HCL 1 MG/ML IJ SOLN
1.0000 mg | Freq: Once | INTRAMUSCULAR | Status: AC
  Administered 2021-04-17: 1 mg via INTRAVENOUS
  Filled 2021-04-17: qty 1

## 2021-04-17 MED ORDER — SODIUM CHLORIDE 0.9 % IV BOLUS
1000.0000 mL | Freq: Once | INTRAVENOUS | Status: AC
  Administered 2021-04-17: 1000 mL via INTRAVENOUS

## 2021-04-17 MED ORDER — DIPHENHYDRAMINE HCL 50 MG/ML IJ SOLN
25.0000 mg | Freq: Once | INTRAMUSCULAR | Status: AC
  Administered 2021-04-17: 25 mg via INTRAVENOUS
  Filled 2021-04-17: qty 1

## 2021-04-17 MED ORDER — FLEET ENEMA 7-19 GM/118ML RE ENEM
1.0000 | ENEMA | Freq: Once | RECTAL | Status: AC
  Administered 2021-04-17: 1 via RECTAL
  Filled 2021-04-17: qty 1

## 2021-04-17 MED ORDER — FENTANYL CITRATE PF 50 MCG/ML IJ SOSY
50.0000 ug | PREFILLED_SYRINGE | Freq: Once | INTRAMUSCULAR | Status: AC
  Administered 2021-04-17: 50 ug via INTRAVENOUS
  Filled 2021-04-17: qty 1

## 2021-04-17 MED ORDER — METOCLOPRAMIDE HCL 5 MG/ML IJ SOLN
10.0000 mg | Freq: Once | INTRAMUSCULAR | Status: AC
  Administered 2021-04-17: 10 mg via INTRAVENOUS
  Filled 2021-04-17: qty 2

## 2021-04-17 MED ORDER — GLYCERIN (LAXATIVE) 2 G RE SUPP
1.0000 | Freq: Once | RECTAL | Status: AC
Start: 2021-04-17 — End: 2021-04-17
  Administered 2021-04-17: 1 via RECTAL
  Filled 2021-04-17: qty 1

## 2021-04-17 MED ORDER — ONDANSETRON HCL 4 MG/2ML IJ SOLN
4.0000 mg | Freq: Once | INTRAMUSCULAR | Status: AC
  Administered 2021-04-17: 4 mg via INTRAVENOUS
  Filled 2021-04-17: qty 2

## 2021-04-17 MED ORDER — IOHEXOL 350 MG/ML SOLN
80.0000 mL | Freq: Once | INTRAVENOUS | Status: AC | PRN
  Administered 2021-04-17: 80 mL via INTRAVENOUS

## 2021-04-17 MED ORDER — LACTATED RINGERS IV BOLUS
1000.0000 mL | Freq: Once | INTRAVENOUS | Status: DC
  Administered 2021-04-17: 1000 mL via INTRAVENOUS

## 2021-04-17 NOTE — ED Notes (Signed)
Patient resting. Per visitor, patient has passed more gas, but no bowel movement.

## 2021-04-17 NOTE — ED Triage Notes (Signed)
Patient presents from home via EMS with complaints of RLQ pain x12 hours. Patient has hx of possible ovarian cyst, which when they attempted to do her [place] previously, possibly ruptured the cyst. Patient states same pain as before. Endorses nausea, no vomiting.

## 2021-04-17 NOTE — ED Provider Notes (Signed)
[facility] DEPT Provider Note   CSN: [id number] Arrival date & time: 04/17/21  0059     History  Chief Complaint  Patient presents with   Abdominal Pain    Tanya Warren is a 51 y.o. female.   Abdominal Pain  51 year old female with medical history presenting to the emergency department with a chief complaint of abdominal pain.  The patient states that she has a history of right ovarian cyst which caused severe pain on presentation to the emergency department previously.  She states that for the past 12 hours she has had right lower quadrant pain that is sharp, shooting.  She is concerned that she has had a ruptured ovarian cyst pain feels similar.  She denies any vaginal bleeding or vaginal discharge.  She states that she had a bowel movement this morning that was normal and is currently passing gas.  She does not feel overly constipated.  Pain is nonmigratory.  It is constant.  She denies any vaginal discharge or vaginal bleeding.  She denies any hematochezia or melena.  She endorses nausea but has had no episodes of emesis. Denies any dysuria or increased urinary frequency.  Home Medications Prior to Admission medications   Medication Sig Start Date End Date Taking? Authorizing Provider  azithromycin (ZITHROMAX) 250 MG tablet Take 2 tabs on day 1, then 1 tab daily until finished 01/02/21   Marin Olp, MD  guaiFENesin-codeine 100-10 MG/5ML syrup Take 5 mLs by mouth every 6 (six) hours as needed for cough (do not drive for 8 hours after taking.). 12/30/20   Marin Olp, MD      Allergies    Cinnamon, Penicillins, Allantoin-pramoxine, and Neosporin [neomycin-bacitracin zn-polymyx]    Review of Systems   Review of Systems  Gastrointestinal:  Positive for abdominal pain.   Physical Exam Updated Vital Signs BP 137/82 (BP Location: Right Arm)    Pulse 80    Temp 97.9 F (36.6 C) (Oral)    Resp 17    SpO2 100%  Physical Exam Vitals and  nursing note reviewed.  Constitutional:      General: She is not in acute distress.    Appearance: She is well-developed.  HENT:     Head: Normocephalic and atraumatic.  Eyes:     Conjunctiva/sclera: Conjunctivae normal.     Pupils: Pupils are equal, round, and reactive to light.  Cardiovascular:     Rate and Rhythm: Normal rate and regular rhythm.     Heart sounds: No murmur heard. Pulmonary:     Effort: Pulmonary effort is normal. No respiratory distress.     Breath sounds: Normal breath sounds.  Abdominal:     General: There is no distension.     Palpations: Abdomen is soft.     Tenderness: There is abdominal tenderness in the right lower quadrant. There is guarding. There is no rebound.  Musculoskeletal:        General: No swelling, deformity or signs of injury.     Cervical back: Neck supple.  Skin:    General: Skin is warm and dry.     Capillary Refill: Capillary refill takes less than 2 seconds.     Findings: No lesion or rash.  Neurological:     General: No focal deficit present.     Mental Status: She is alert. Mental status is at baseline.  Psychiatric:        Mood and Affect: Mood normal.    ED Results /  Procedures / Treatments   Labs (all labs ordered are listed, but only abnormal results are displayed) Labs Reviewed  COMPREHENSIVE METABOLIC PANEL - Abnormal; Notable for the following components:      Result Value   Glucose, Bld 114 (*)    Calcium 8.3 (*)    All other components within normal limits  URINALYSIS, ROUTINE W REFLEX MICROSCOPIC - Abnormal; Notable for the following components:   Color, Urine COLORLESS (*)    All other components within normal limits  RESP PANEL BY RT-PCR (FLU A&B, COVID) ARPGX2  LIPASE, BLOOD  CBC WITH DIFFERENTIAL/PLATELET  LACTIC ACID, PLASMA  I-STAT BETA HCG BLOOD, ED ([facility], [facility], [facility] ONLY)    EKG None  Radiology CT ABDOMEN PELVIS W CONTRAST  Result Date: 04/17/2021 CLINICAL DATA:  51 year old female with right lower  quadrant pain for 12 hours. History of symptomatic ovarian cyst. EXAM: CT ABDOMEN AND PELVIS WITH CONTRAST TECHNIQUE: Multidetector CT imaging of the abdomen and pelvis was performed using the standard protocol following bolus administration of intravenous contrast. CONTRAST:  58mL OMNIPAQUE IOHEXOL 350 MG/ML SOLN COMPARISON:  CT Abdomen and Pelvis 05/23/2015. FINDINGS: Lower chest: Since 2017 there are multiple new indistinct lung base pulmonary nodules bilaterally. Bilateral visible middle and lower lobes affected. The nodules appear solid with indistinct margins, up to 5 mm and are peribronchial and subpleural. No pleural or pericardial effusion. Hepatobiliary: Negative liver and gallbladder. No bile duct enlargement. Pancreas: Negative. Spleen: Negative. Adrenals/Urinary Tract: Normal adrenal glands. Kidneys appears stable and normal. No hydronephrosis, nephrolithiasis, or pararenal inflammation. Symmetric renal contrast excretion on the delayed images. Diminutive ureters. Unremarkable bladder. Stomach/Bowel: Redundant large bowel in the pelvis mildly distended with gas throughout. Upstream retained stool in the large bowel beginning with the descending colon. Heterogeneous appearing stool in the cecum, but normal appendix (coronal image 70) and no large bowel inflammation. Terminal ileum is decompressed and negative. No dilated small bowel. Stomach is moderately distended with fluid and food. Duodenum is decompressed and negative. No free air, free fluid, or mesenteric inflammatory stranding identified. Vascular/Lymphatic: Major arterial structures throughout the abdomen and pelvis appear patent and normal. Portal venous system is patent. Central venous structures in the abdomen and pelvis also appear patent. No lymphadenopathy identified. Reproductive: Negative. The right ovary appears diminutive on series 2, image 67. Other: No pelvic free fluid. Musculoskeletal: Stable visualized osseous structures. No  significant osseous abnormality. IMPRESSION: 1. Normal appendix. With no acute or inflammatory process identified in the abdomen or pelvis. 2. However, there are numerous small new pulmonary nodules (up to 5 mm) scattered in the visible bilateral middle and lower lobes. They have indistinct margins favoring a pulmonary inflammatory process. Consider viral/atypical respiratory infection and dedicated chest imaging may be valuable. No pleural effusion. No routine follow-up imaging is recommended per Fleischner Society Guidelines. These guidelines do not apply to immunocompromised patients and patients with cancer. For lung cancer screening, adhere to Lung-RADS guidelines. Reference: Radiology. 2017; 284(1):228-43. Electronically Signed   By: Genevie Ann M.D.   On: 04/17/2021 04:24   US PELVIC COMPLETE W TRANSVAGINAL AND TORSION R/O  Result Date: 04/17/2021 CLINICAL DATA:  Pelvic pain. EXAM: TRANSABDOMINAL AND TRANSVAGINAL ULTRASOUND OF PELVIS DOPPLER ULTRASOUND OF OVARIES TECHNIQUE: Both transabdominal and transvaginal ultrasound examinations of the pelvis were performed. Transabdominal technique was performed for global imaging of the pelvis including uterus, ovaries, adnexal regions, and pelvic cul-de-sac. It was necessary to proceed with endovaginal exam following the transabdominal exam to visualize the endometrium and ovaries. Color and  duplex Doppler ultrasound was utilized to evaluate blood flow to the ovaries. COMPARISON:  Ultrasound dated 08/26/2017. FINDINGS: Uterus Measurements: 7.0 x 3.7 x 5.0 cm = volume: 67 mL. The uterus is anteverted and demonstrates a heterogeneous echotexture. Endometrium Thickness: 3 mm. The endometrium is poorly visualized and suboptimally evaluated. Right ovary Measurements: 2.2 x 1.4 x 1.7 cm = volume: 2.6 mL. The ovary is unremarkable as visualized. Left ovary Measurements: 1.8 x 1.6 x 1.4 cm = volume: 2.1 mL. Normal appearance/no adnexal mass. Pulsed Doppler evaluation of both  ovaries demonstrates normal low-resistance arterial and venous waveforms. Other findings No abnormal free fluid. IMPRESSION: 1. Heterogeneous uterus. 2. Unremarkable ovaries. Electronically Signed   By: Anner Crete M.D.   On: 04/17/2021 02:23    Procedures Procedures    Medications Ordered in ED Medications  sodium chloride 0.9 % bolus 1,000 mL (0 mLs Intravenous Stopped 04/17/21 0319)  fentaNYL (SUBLIMAZE) injection 50 mcg (50 mcg Intravenous Given 04/17/21 0144)  HYDROmorphone (DILAUDID) injection 1 mg (1 mg Intravenous Given 04/17/21 0245)  ondansetron (ZOFRAN) injection 4 mg (4 mg Intravenous Given 04/17/21 0244)  HYDROmorphone (DILAUDID) injection 1 mg (1 mg Intravenous Given 04/17/21 0415)  iohexol (OMNIPAQUE) 350 MG/ML injection 80 mL (80 mLs Intravenous Contrast Given 04/17/21 0405)  dicyclomine (BENTYL) capsule 10 mg (10 mg Oral Given 04/17/21 0500)  sodium phosphate (FLEET) 7-19 GM/118ML enema 1 enema (1 enema Rectal Given 04/17/21 0506)  Glycerin (Adult) 2 g suppository 1 suppository (1 suppository Rectal Given 04/17/21 0506)  metoCLOPramide (REGLAN) injection 10 mg (10 mg Intravenous Given 04/17/21 0630)  diphenhydrAMINE (BENADRYL) injection 25 mg (25 mg Intravenous Given 04/17/21 0630)    ED Course/ Medical Decision Making/ A&P                           Medical Decision Making  51 year old female with medical history presenting to the emergency department with a chief complaint of abdominal pain.  The patient states that she has a history of right ovarian cyst which caused severe pain on presentation to the emergency department previously.  She states that for the past 12 hours she has had right lower quadrant pain that is sharp, shooting.  She is concerned that she has had a ruptured ovarian cyst pain feels similar.  She denies any vaginal bleeding or vaginal discharge.  She states that she had a bowel movement this morning that was normal and is currently passing gas.  She does not feel  overly constipated.  Pain is nonmigratory.  It is constant.  She denies any vaginal discharge or vaginal bleeding.  She denies any hematochezia or melena.  She endorses nausea but has had no episodes of emesis.  Denies any dysuria or increased urinary frequency.  On arrival, the patient was afebrile, hemodynamically stable, saturating well on room air.  Normal sinus rhythm noted on cardiac telemetry.  The patient's physical exam was significant for mild guarding with right lower quadrant tenderness to palpation.  Differential diagnosis includes appendicitis, ovarian torsion, ruptured ovarian cyst, constipation, colitis, less likely SBO, nephrolithiasis, pyelonephritis/UTI, mesenteric ischemia.  Laboratory work-up significant for COVID-19 and influenza PCR negative, lipase normal, CBC unremarkable without a leukocytosis, CMP also generally unremarkable with normal renal function, no biliary dysfunction, urinalysis negative for UTI.  Patient's lactic acid was normal 1.6 and her beta-hCG was normal.  IV access was obtained and the patient was administered multiple rounds of fluids, antiemetics and IV opiates with improvement in  her discomfort.  The patient did have persistence of her pain and discomfort despite these interventions initially.  A pelvic ultrasound was performed which was negative for ovarian torsion or evidence for ovarian cyst.  No other abnormalities were noted on ultrasound.  A CT abdomen pelvis was performed which was reviewed by myself in addition to radiology and was negative for acute abnormalities in the abdomen or pelvis.  Of note in the body of the read there was some concern for bowel gas in the right lower quadrant With redundant large bowel in the pelvis mildly distended with gas throughout with upstream retained stool in the large bowel beginning in the descending colon.  Normal appendix was noted with no large bowel inflammation and no evidence of small bowel obstruction.  No  other abnormalities with no free fluid, free air or mesenteric inflammatory stranding.  Given the patient's normal lactic acid and no evidence of inflammatory stranding, pain in the right lower quadrant, not pain out of portion to exam, low suspicion for mesenteric ischemia at this time.  No evidence for SBO.  I discussed with the patient her findings on CT.  Findings could be due to mild constipation with resultant pain from gas buildup.  The patient did agree to attempts at enema in the emergency department.  This did result in 2 large bowel movements in the ED and large passage of flatus.  This did ultimately resulted in resolution of the patient's pain.  Of note, the patient did have incidental findings on CT of multiple nodules in the lungs.  She did recently have COVID-19 infection and this could reflect scarring from pulmonary fibrosis or potential other etiology.  I discussed the possibility of obtaining chest x-ray imaging although no follow-up imaging was recommended per guidelines and the patient subsequently declined imaging.  These findings were communicated with the patient.  On multiple reassessments following the above interventions, the patient felt symptomatically improved and comfortable with the plan for discharge home at this time.  The patient has been appropriately medically screened and/or stabilized in the ED. I have low suspicion for any other emergent medical condition which would require further screening, evaluation or treatment in the ED or require inpatient management.  Final Clinical Impression(s) / ED Diagnoses Final diagnoses:  Pelvic pain  Right lower quadrant abdominal pain    Rx / DC Orders ED Discharge Orders     None         Regan Lemming, MD 04/17/21 2326

## 2021-04-17 NOTE — ED Notes (Signed)
Patient states she was able to pass gas, but no bowel movement yet. Will continue to monitor.

## 2021-04-17 NOTE — Discharge Instructions (Addendum)
You were evaluated in the Emergency Department and after careful evaluation, we did not find any emergent condition requiring admission or further testing in the hospital.  Your exam/testing today was overall reassuring.  Your laboratory work-up was very reassuring with no acute abnormalities noted.  Your pelvic ultrasound did not reveal evidence of ovarian torsion or ovarian cyst.  Your abdominal CT did not reveal significant abnormalities.  Full CT read is as follows:  Stomach/Bowel: Redundant large bowel in the pelvis mildly distended  with gas throughout. Upstream retained stool in the large bowel  beginning with the descending colon. Heterogeneous appearing stool  in the cecum, but normal appendix (coronal image 70) and no large  bowel inflammation. Terminal ileum is decompressed and negative. No  dilated small bowel. Stomach is moderately distended with fluid and  food. Duodenum is decompressed and negative.     No free air, free fluid, or mesenteric inflammatory stranding  identified.    IMPRESSION:  1. Normal appendix. With no acute or inflammatory process identified  in the abdomen or pelvis.     2. However, there are numerous small new pulmonary nodules (up to 5  mm) scattered in the visible bilateral middle and lower lobes. They  have indistinct margins favoring a pulmonary inflammatory process.  Consider viral/atypical respiratory infection and dedicated chest  imaging may be valuable. No pleural effusion.  No routine follow-up imaging is recommended per Fleischner Society  Guidelines.  These guidelines do not apply to immunocompromised patients and  patients with cancer. For lung cancer screening, adhere to Lung-RADS  guidelines. Reference: Radiology. 2017; 284(1):228-43.   The new pulmonary nodules could be fibrosis after your COVID infection.  They do warrant routine follow-up with your PCP.  Please return to the Emergency Department if you experience any worsening of  your condition.  Thank you for allowing [place] to be a part of your care.

## 2021-04-17 NOTE — ED Notes (Signed)
US at bedside

## 2021-05-01 ENCOUNTER — Telehealth (INDEPENDENT_AMBULATORY_CARE_PROVIDER_SITE_OTHER): Payer: BC Managed Care – PPO | Admitting: Physician Assistant

## 2021-05-01 DIAGNOSIS — R918 Other nonspecific abnormal finding of lung field: Secondary | ICD-10-CM | POA: Diagnosis not present

## 2021-05-01 NOTE — Progress Notes (Signed)
° °  Virtual Visit via Video Note  I connected with  Tanya Warren  on 05/01/21 at  1:30 PM EST by a video enabled telemedicine application and verified that I am speaking with the correct person using two identifiers.  Location: Patient: home Provider: Therapist, music at [facility] present: Patient and myself   I discussed the limitations of evaluation and management by telemedicine and the availability of in person appointments. The patient expressed understanding and agreed to proceed.   History of Present Illness:  51 yo female patient presents for virtual visit about abnormal findings on CT. States she went to the ED on 04/17/21 for RLQ abd pain, worried about appendicitis or ruptured cyst. CT abd pelvis was negative for acute abnormalities to explain her pain. Pain has since resolved.  CT abd pelv showed incidental new multiple nodules of the lungs. She was offered a designated CXR in the ED, but declined and elected to f/up outpatient. -Pt has never smoked, grew up around secondhand smoke her whole life. -Younger brother has stage IV lung cancer, but says he has always smoked.  -COVID-19 in September 23rd to early October 2022, only had terrible cough and very ST through course of illness.   Observations/Objective:   Gen: Awake, alert, no acute distress Resp: Breathing is even and non-labored Psych: calm/pleasant demeanor Neuro: Alert and Oriented x 3, + facial symmetry, speech is clear.   Assessment and Plan:  1. Pulmonary nodules I personally reviewed her CT abd pelv from 04/17/21. Discussed with patient that incidental pulmonary nodules are common findings and per Fleischner Society Guidelines, no routine f/up imaging is recommended. I informed patient I would reach out to pulmonology for a second look and get back to her about this.    Follow Up Instructions:    I discussed the assessment and treatment plan with the patient. The patient was provided an  opportunity to ask questions and all were answered. The patient agreed with the plan and demonstrated an understanding of the instructions.   The patient was advised to call back or seek an in-person evaluation if the symptoms worsen or if the condition fails to improve as anticipated.  Doyce Stonehouse M Darvell Monteforte, PA-C

## 2021-05-01 NOTE — Patient Instructions (Signed)
I will contact our pulmonology group and get back to you about this concern.

## 2021-05-04 ENCOUNTER — Encounter: Payer: Self-pay | Admitting: Physician Assistant

## 2021-05-08 ENCOUNTER — Other Ambulatory Visit: Payer: Self-pay | Admitting: Physician Assistant

## 2021-05-08 DIAGNOSIS — R918 Other nonspecific abnormal finding of lung field: Secondary | ICD-10-CM

## 2021-05-09 ENCOUNTER — Encounter: Payer: BC Managed Care – PPO | Admitting: Physician Assistant

## 2021-07-23 ENCOUNTER — Encounter: Payer: Self-pay | Admitting: Physician Assistant

## 2021-08-07 ENCOUNTER — Encounter: Payer: Self-pay | Admitting: Physician Assistant

## 2021-08-08 ENCOUNTER — Other Ambulatory Visit: Payer: Self-pay

## 2021-08-08 DIAGNOSIS — R918 Other nonspecific abnormal finding of lung field: Secondary | ICD-10-CM

## 2021-08-18 ENCOUNTER — Ambulatory Visit
Admission: RE | Admit: 2021-08-18 | Discharge: 2021-08-18 | Disposition: A | Discharge status: Home / Self Care | Payer: BC Managed Care – PPO | Source: Ambulatory Visit | Attending: Physician Assistant | Admitting: Physician Assistant

## 2021-08-18 DIAGNOSIS — R918 Other nonspecific abnormal finding of lung field: Secondary | ICD-10-CM

## 2022-01-01 ENCOUNTER — Encounter: Payer: Self-pay | Admitting: *Deleted

## 2022-01-01 ENCOUNTER — Ambulatory Visit: Payer: BC Managed Care – PPO | Admitting: Obstetrics & Gynecology

## 2022-02-08 ENCOUNTER — Other Ambulatory Visit: Payer: Self-pay | Admitting: Obstetrics & Gynecology

## 2022-02-08 DIAGNOSIS — Z1231 Encounter for screening mammogram for malignant neoplasm of breast: Secondary | ICD-10-CM

## 2022-02-12 ENCOUNTER — Ambulatory Visit
Admission: RE | Admit: 2022-02-12 | Discharge: 2022-02-12 | Disposition: A | Discharge status: Home / Self Care | Payer: BC Managed Care – PPO | Source: Ambulatory Visit | Attending: Obstetrics & Gynecology | Admitting: Obstetrics & Gynecology

## 2022-02-12 DIAGNOSIS — Z1231 Encounter for screening mammogram for malignant neoplasm of breast: Secondary | ICD-10-CM

## 2022-02-14 ENCOUNTER — Other Ambulatory Visit: Payer: Self-pay | Admitting: Obstetrics & Gynecology

## 2022-02-14 DIAGNOSIS — R928 Other abnormal and inconclusive findings on diagnostic imaging of breast: Secondary | ICD-10-CM

## 2022-02-28 ENCOUNTER — Ambulatory Visit
Admission: RE | Admit: 2022-02-28 | Discharge: 2022-02-28 | Disposition: A | Discharge status: Home / Self Care | Payer: BC Managed Care – PPO | Source: Ambulatory Visit | Attending: Obstetrics & Gynecology | Admitting: Obstetrics & Gynecology

## 2022-02-28 ENCOUNTER — Other Ambulatory Visit: Payer: Self-pay | Admitting: Obstetrics & Gynecology

## 2022-02-28 DIAGNOSIS — R928 Other abnormal and inconclusive findings on diagnostic imaging of breast: Secondary | ICD-10-CM

## 2022-02-28 DIAGNOSIS — R921 Mammographic calcification found on diagnostic imaging of breast: Secondary | ICD-10-CM

## 2022-03-06 ENCOUNTER — Ambulatory Visit
Admission: RE | Admit: 2022-03-06 | Discharge: 2022-03-06 | Disposition: A | Discharge status: Home / Self Care | Payer: BC Managed Care – PPO | Source: Ambulatory Visit | Attending: Obstetrics & Gynecology | Admitting: Obstetrics & Gynecology

## 2022-03-06 DIAGNOSIS — R921 Mammographic calcification found on diagnostic imaging of breast: Secondary | ICD-10-CM

## 2022-03-06 HISTORY — PX: BREAST BIOPSY: SHX20

## 2022-03-19 ENCOUNTER — Telehealth (INDEPENDENT_AMBULATORY_CARE_PROVIDER_SITE_OTHER): Payer: BC Managed Care – PPO | Admitting: Family Medicine

## 2022-03-19 VITALS — Ht 65.75 in | Wt 165.0 lb

## 2022-03-19 DIAGNOSIS — J069 Acute upper respiratory infection, unspecified: Secondary | ICD-10-CM | POA: Diagnosis not present

## 2022-03-19 MED ORDER — HYDROCODONE BIT-HOMATROP MBR 5-1.5 MG/5ML PO SOLN: 5.0000 mL | Freq: Three times a day (TID) | ORAL | 0 refills | Status: DC | PRN

## 2022-03-19 NOTE — Progress Notes (Signed)
[facility] [address] [place] [address] Dept: [phone] Dept Fax: [phone]  Virtual Video Visit  I connected with Tanya Warren on 03/19/22 at  2:20 PM EST by a video enabled telemedicine application and verified that I am speaking with the correct person using two identifiers.  Location patient: Home Location provider: Clinic Persons participating in the virtual visit: Patient, Provider  I discussed the limitations of evaluation and management by telemedicine and the availability of in person appointments. The patient expressed understanding and agreed to proceed.  Chief Complaint  Patient presents with   Acute Visit    C/o having severe cough and body aches x 3 days.  Negative for Covid.  Has taken Robitussin and Thera flu    SUBJECTIVE:  HPI: Tanya Warren is a 51 y.o. female who presents with a 3-day history of illness. When this started, she had body aches, which have now resolved. She has an ongoing cough with has been bothersome. She has been using Robitussin and Theraflu, but can't tell they have helped much. She did do a home COVID test yesterday, which was neg. She has been isolating at home in her bed. She is not a smoker and does not have a history of asthma.  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/29/2019   Past Surgical History:  Procedure Laterality Date   BREAST BIOPSY Left 03/06/2022   MM LT BREAST BX W LOC DEV 1ST LESION IMAGE BX SPEC STEREO GUIDE 03/06/2022 [facility]   BREAST EXCISIONAL BIOPSY Left 08/2016   INTRADUCTAL PAPILLOMA   BREAST LUMPECTOMY WITH RADIOACTIVE SEED LOCALIZATION Left 08/30/2016   Procedure: LEFT BREAST LUMPECTOMY WITH RADIOACTIVE SEED LOCALIZATION;  Surgeon: Autumn Messing III, MD;  Location: [facility];  Service: General;  Laterality: Left;   DILATION AND CURETTAGE OF UTERUS     done in GYN office   ROTATOR  CUFF REPAIR     Family History  Problem Relation Age of Onset   Alcohol abuse Mother    Mental illness Mother    Diabetes Father    Heart attack Father    Heart disease Father    Mental illness Father    Alcohol abuse Sister    Mental illness Sister    Breast cancer Neg Hx    Colon cancer Neg Hx    Colon polyps Neg Hx    Esophageal cancer Neg Hx    Rectal cancer Neg Hx    Stomach cancer Neg Hx    Social History   Tobacco Use   Smoking status: Never   Smokeless tobacco: Never  Vaping Use   Vaping Use: Never used  Substance Use Topics   Alcohol use: Yes    Comment: social   Drug use: No    Current Outpatient Medications:    HYDROcodone bit-homatropine (HYCODAN) 5-1.5 MG/5ML syrup, Take 5 mLs by mouth every 8 (eight) hours as needed for cough., Disp: 120 mL, Rfl: 0  Allergies  Allergen Reactions   Cinnamon Anaphylaxis   Penicillins Anaphylaxis and Rash    Has patient had a PCN reaction causing immediate rash, facial/tongue/throat swelling, SOB or lightheadedness with hypotension: Yes Has patient had a PCN reaction causing severe rash involving mucus membranes or skin necrosis: No Has patient had a PCN reaction that required hospitalization No Has patient had a PCN reaction occurring within the last 10 years: No If all of the  above answers are "NO", then may proceed with Cephalosporin use.    Allantoin-Pramoxine    Neosporin [Neomycin-Bacitracin Zn-Polymyx] Swelling and Rash    ROS: See pertinent positives and negatives per HPI.  OBSERVATIONS/OBJECTIVE:  VITALS per patient if applicable: Today's Vitals   03/19/22 1421  Weight: 165 lb (74.8 kg)  Height: 5' 5.75" (1.67 m)   Body mass index is 26.83 kg/m.    GENERAL: Alert and oriented. Appears well and in no acute distress.  HEENT: Atraumatic. Eyes clear. No obvious abnormalities on inspection of external nose and ears.  NECK: Normal movements of the head and neck.  LUNGS: On inspection, no signs of  respiratory distress. Breathing rate appears normal. No obvious gross SOB, gasping or wheezing, and no conversational dyspnea.  CV: No obvious cyanosis.  MS: Moves all visible extremities without noticeable abnormality.  PSYCH/NEURO: Pleasant and cooperative. No obvious depression or anxiety. Speech and thought processing grossly intact.  ASSESSMENT AND PLAN:  1. Viral URI with cough Discussed home care for viral illness, including rest, pushing fluids, and OTC medications as needed for symptom relief. Recommend hot tea with honey for sore throat symptoms. Follow-up if needed for worsening or persistent symptoms.  - HYDROcodone bit-homatropine (HYCODAN) 5-1.5 MG/5ML syrup; Take 5 mLs by mouth every 8 (eight) hours as needed for cough.  Dispense: 120 mL; Refill: 0    I discussed the assessment and treatment plan with the patient. The patient was provided an opportunity to ask questions and all were answered. The patient agreed with the plan and demonstrated an understanding of the instructions.   The patient was advised to call back or seek an in-person evaluation if the symptoms worsen or if the condition fails to improve as anticipated.  Return if symptoms worsen or fail to improve.   Haydee Salter, MD

## 2022-03-22 ENCOUNTER — Ambulatory Visit (INDEPENDENT_AMBULATORY_CARE_PROVIDER_SITE_OTHER)
Admission: RE | Admit: 2022-03-22 | Discharge: 2022-03-22 | Disposition: A | Discharge status: Home / Self Care | Payer: BC Managed Care – PPO | Source: Ambulatory Visit | Attending: Physician Assistant | Admitting: Physician Assistant

## 2022-03-22 ENCOUNTER — Encounter: Payer: Self-pay | Admitting: Physician Assistant

## 2022-03-22 ENCOUNTER — Ambulatory Visit (INDEPENDENT_AMBULATORY_CARE_PROVIDER_SITE_OTHER): Payer: BC Managed Care – PPO | Admitting: Physician Assistant

## 2022-03-22 VITALS — BP 142/92 | HR 110 | Temp 98.0°F | Ht 65.75 in | Wt 167.0 lb

## 2022-03-22 DIAGNOSIS — R051 Acute cough: Secondary | ICD-10-CM | POA: Diagnosis not present

## 2022-03-22 NOTE — Progress Notes (Signed)
Subjective:    Patient ID: Tanya Warren, female    DOB: 1970/09/26, 51 y.o.   MRN: [id number]  Chief Complaint  Patient presents with   Cough    Pt c/o cough, tested negative for Covid on 03/20/22; pt started with fever, chills, body aches over weekend, then got the cough, achy, congestion, had televisit Monday and got cough meds to help rest.     Cough   Patient is in today for acute sick symptoms.   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   Past Medical History:  Diagnosis Date   Headache    History of chicken pox    Papilloma of left breast     Past Surgical History:  Procedure Laterality Date   BREAST BIOPSY Left 03/06/2022   MM LT BREAST BX W LOC DEV 1ST LESION IMAGE BX SPEC STEREO GUIDE 03/06/2022 [facility]   BREAST EXCISIONAL BIOPSY Left 08/2016   INTRADUCTAL PAPILLOMA   BREAST LUMPECTOMY WITH RADIOACTIVE SEED LOCALIZATION Left 08/30/2016   Procedure: LEFT BREAST LUMPECTOMY WITH RADIOACTIVE SEED LOCALIZATION;  Surgeon: Jovita Kussmaul, MD;  Location: [place];  Service: General;  Laterality: Left;   DILATION AND CURETTAGE OF UTERUS     done in GYN office   ROTATOR CUFF REPAIR      Family History  Problem Relation Age of Onset   Alcohol abuse Mother    Mental illness Mother    Diabetes Father    Heart attack Father    Heart disease Father    Mental illness Father    Alcohol abuse Sister    Mental illness Sister    Breast cancer Neg Hx    Colon cancer Neg Hx    Colon polyps Neg Hx    Esophageal cancer Neg Hx    Rectal cancer Neg Hx    Stomach cancer Neg Hx     Social History   Tobacco  Use   Smoking status: Never   Smokeless tobacco: Never  Vaping Use   Vaping Use: Never used  Substance Use Topics   Alcohol use: Yes    Comment: social   Drug use: No     Allergies  Allergen Reactions   Cinnamon Anaphylaxis   Penicillins Anaphylaxis and Rash    Has patient had a PCN reaction causing immediate rash, facial/tongue/throat swelling, SOB or lightheadedness with hypotension: Yes Has patient had a PCN reaction causing severe rash involving mucus membranes or skin necrosis: No Has patient had a PCN reaction that required hospitalization No Has patient had a PCN reaction occurring within the last 10 years: No If all of the above answers are "NO", then may proceed with Cephalosporin use.    Allantoin-Pramoxine    Neosporin [Neomycin-Bacitracin Zn-Polymyx] Swelling and Rash    Review of Systems  Respiratory:  Positive for cough.    NEGATIVE UNLESS OTHERWISE INDICATED IN HPI      Objective:     BP (!) 142/92 (BP Location: Left Arm)   Pulse (!) 110   Temp 98 F (36.7 C) (Temporal)   Ht 5' 5.75" (1.67 m)   Wt 167 lb (75.8 kg)   SpO2 98%   BMI 27.16  kg/m   Wt Readings from Last 3 Encounters:  03/22/22 167 lb (75.8 kg)  03/19/22 165 lb (74.8 kg)  12/30/20 158 lb (71.7 kg)    BP Readings from Last 3 Encounters:  03/22/22 (!) 142/92  04/17/21 137/82  12/30/20 130/88     Physical Exam Vitals and nursing note reviewed.  Constitutional:      General: She is not in acute distress.    Appearance: Normal appearance. She is not ill-appearing.  HENT:     Head: Normocephalic.     Right Ear: Tympanic membrane, ear canal and external ear normal.     Left Ear: Tympanic membrane, ear canal and external ear normal.     Nose: Congestion present.     Mouth/Throat:     Mouth: Mucous membranes are moist.     Pharynx: No oropharyngeal exudate or posterior oropharyngeal erythema.  Eyes:     Extraocular Movements: Extraocular movements intact.     Conjunctiva/sclera:  Conjunctivae normal.     Pupils: Pupils are equal, round, and reactive to light.  Cardiovascular:     Rate and Rhythm: Normal rate and regular rhythm.     Pulses: Normal pulses.     Heart sounds: Normal heart sounds. No murmur heard. Pulmonary:     Effort: Pulmonary effort is normal. No respiratory distress.     Breath sounds: Rhonchi (scattered) present. No wheezing.     Comments: Paroxysmal coughing  Musculoskeletal:     Cervical back: Normal range of motion.  Skin:    General: Skin is warm.  Neurological:     Mental Status: She is alert and oriented to person, place, and time.  Psychiatric:        Mood and Affect: Mood normal.        Behavior: Behavior normal.        Assessment & Plan:  Acute cough -     DG Chest 2 View; Future   Most likely viral cough.  Discussed with patient did not feel that COVID or flu testing necessary at this time, as she is outside the window for antiviral treatment.  With her severe coughing, discussed possibility for chest x-ray to rule out underlying infiltrate.  She will go to [facility] for this x-ray today.  Pending positive x-ray, will need antibiotic treatment at that time.  Otherwise may need to consider course of steroids, Tessalon Perles, albuterol inhaler to help with her symptoms.  She currently has Hycodan at home we will continue on this for now.     Return if symptoms worsen or fail to improve.  This note was prepared with assistance of Systems analyst. Occasional wrong-word or sound-a-like substitutions may have occurred due to the inherent limitations of voice recognition software.     Kaneesha Constantino M Zareya Tuckett, PA-C

## 2022-03-22 NOTE — Telephone Encounter (Signed)
Please see msg and advise.  

## 2022-03-23 ENCOUNTER — Other Ambulatory Visit: Payer: Self-pay | Admitting: Physician Assistant

## 2022-03-23 MED ORDER — BENZONATATE 100 MG PO CAPS: 100.0000 mg | ORAL_CAPSULE | Freq: Three times a day (TID) | ORAL | 0 refills | Status: DC | PRN

## 2022-03-23 MED ORDER — PREDNISONE 20 MG PO TABS: 20.0000 mg | ORAL_TABLET | Freq: Two times a day (BID) | ORAL | 0 refills | Status: AC

## 2022-03-29 NOTE — Telephone Encounter (Signed)
Please advise if you would like patient to come back to be seen in office, thanks

## 2022-04-11 ENCOUNTER — Telehealth: Payer: Self-pay

## 2022-04-11 ENCOUNTER — Encounter: Payer: Self-pay | Admitting: Obstetrics & Gynecology

## 2022-04-11 ENCOUNTER — Ambulatory Visit (INDEPENDENT_AMBULATORY_CARE_PROVIDER_SITE_OTHER): Payer: BC Managed Care – PPO | Admitting: Obstetrics & Gynecology

## 2022-04-11 VITALS — BP 122/72 | HR 85 | Ht 66.25 in | Wt 173.0 lb

## 2022-04-11 DIAGNOSIS — N951 Menopausal and female climacteric states: Secondary | ICD-10-CM

## 2022-04-11 DIAGNOSIS — Z01419 Encounter for gynecological examination (general) (routine) without abnormal findings: Secondary | ICD-10-CM | POA: Diagnosis not present

## 2022-04-11 DIAGNOSIS — R921 Mammographic calcification found on diagnostic imaging of breast: Secondary | ICD-10-CM

## 2022-04-11 DIAGNOSIS — D242 Benign neoplasm of left breast: Secondary | ICD-10-CM

## 2022-04-11 NOTE — Telephone Encounter (Signed)
Diagnostic mammo and u/s orders placed in Epic with request to schedule in May 2024.

## 2022-04-11 NOTE — Progress Notes (Signed)
Tanya Warren 1971/01/14 [id number]   History:    52 y.o. . [id number]  Same sex relationship, married.   RP:  Established patient presenting for annual gyn exam    HPI: Perimenopause with a light LMP in 06/2021.  No breakthrough bleeding.  Very upbeat with no hot flushes or night sweat.  No pelvic pain.  Pap Neg 11/2020.  Repeat Pap at 3 years.  Urine and bowel movements normal. Colono 07/2020.  Breasts normal. Mammo 02/2022.  Lt Dx Mammo/US suspicious.  Lt breast Bx benign.  Will order a Lt Dx Mammo/US at 6 months.  Body mass index stable at 27.71.  Feeling great on a low carb diet.  Walking 3-5 miles daily.  Had a severe case of Flu during which she had to rest and recover.  Health labs with [facility].   Past medical history,surgical history, family history and social history were all reviewed and documented in the EPIC chart.  Gynecologic History No LMP recorded. (Menstrual status: Perimenopausal).  Obstetric History OB History  Gravida Para Term Preterm AB Living  '4 2 2   2 2  '$ SAB IAB Ectopic Multiple Live Births  2            # Outcome Date GA Lbr Len/2nd Weight Sex Delivery Anes PTL Lv  4 SAB           3 SAB           2 Term           1 Term              ROS: A ROS was performed and pertinent positives and negatives are included in the history. GENERAL: No fevers or chills. HEENT: No change in vision, no earache, sore throat or sinus congestion. NECK: No pain or stiffness. CARDIOVASCULAR: No chest pain or pressure. No palpitations. PULMONARY: No shortness of breath, cough or wheeze. GASTROINTESTINAL: No abdominal pain, nausea, vomiting or diarrhea, melena or bright red blood per rectum. GENITOURINARY: No urinary frequency, urgency, hesitancy or dysuria. MUSCULOSKELETAL: No joint or muscle pain, no back pain, no recent trauma. DERMATOLOGIC: No rash, no itching, no lesions. ENDOCRINE: No polyuria, polydipsia, no heat or cold intolerance. No recent change in weight. HEMATOLOGICAL: No  anemia or easy bruising or bleeding. NEUROLOGIC: No headache, seizures, numbness, tingling or weakness. PSYCHIATRIC: No depression, no loss of interest in normal activity or change in sleep pattern.     Exam:   BP 122/72   Pulse 85   Ht 5' 6.25" (1.683 m)   Wt 173 lb (78.5 kg)   SpO2 99%   BMI 27.71 kg/m   Body mass index is 27.71 kg/m.  General appearance : Well developed well nourished female. No acute distress HEENT: Eyes: no retinal hemorrhage or exudates,  Neck supple, trachea midline, no carotid bruits, no thyroidmegaly Lungs: Clear to auscultation, no rhonchi or wheezes, or rib retractions  Heart: Regular rate and rhythm, no murmurs or gallops Breast:Examined in sitting and supine position were symmetrical in appearance, no palpable masses or tenderness,  no skin retraction, no nipple inversion, no nipple discharge, no skin discoloration, no axillary or supraclavicular lymphadenopathy Abdomen: no palpable masses or tenderness, no rebound or guarding Extremities: no edema or skin discoloration or tenderness  Pelvic: Vulva: Normal             Vagina: No gross lesions or discharge  Cervix: No gross lesions or discharge  Uterus  AV, normal size, shape  and consistency, non-tender and mobile  Adnexa  Without masses or tenderness  Anus: Normal   Assessment/Plan:  52 y.o. female for annual exam   1. Well female exam with routine gynecological exam Perimenopause with a light LMP in 06/2021.  No breakthrough bleeding.  Very upbeat with no hot flushes or night sweat.  No pelvic pain.  Pap Neg 11/2020.  Repeat Pap at 3 years.  Urine and bowel movements normal. Colono 07/2020.  Breasts normal. Mammo 02/2022.  Lt Dx Mammo/US suspicious.  Lt breast Bx benign.  Will order a Lt Dx Mammo/US at 6 months.  Body mass index stable at 27.71.  Feeling great on a low carb diet.  Walking 3-5 miles daily.  Had a severe case of Flu during which she had to rest and recover.  [facility] labs with [facility].  2.  Perimenopause Well tolerated.  Observing.  3. Papilloma of left breast  H/O Lt breast papilloma.  Last Lt Breast Bx benign 02/2022.  Repeat Lt Dx mammo/US at 6 months in 08/2022.  Princess Bruins MD, 8:41 AM

## 2022-04-11 NOTE — Telephone Encounter (Signed)
Order a Lt Dx mammo/US for 08/2022 Received: Today Tanya Warren, [facility] Triage Left Breast Bx benign 02/2022.  Repeat Dx mammo/US at 6 months. (May 2024)

## 2022-05-03 ENCOUNTER — Ambulatory Visit: Payer: BC Managed Care – PPO | Admitting: Family

## 2022-05-03 VITALS — BP 159/87 | HR 107 | Temp 98.0°F | Ht 66.0 in | Wt 168.2 lb

## 2022-05-03 DIAGNOSIS — N3090 Cystitis, unspecified without hematuria: Secondary | ICD-10-CM | POA: Diagnosis not present

## 2022-05-03 LAB — POCT URINALYSIS DIPSTICK
Bilirubin, UA: NEGATIVE
Glucose, UA: NEGATIVE
Ketones, UA: NEGATIVE
Nitrite, UA: NEGATIVE
Protein, UA: NEGATIVE
Spec Grav, UA: 1.01 (ref 1.010–1.025)
Urobilinogen, UA: NEGATIVE E.U./dL — AB
pH, UA: 6 (ref 5.0–8.0)

## 2022-05-03 MED ORDER — SULFAMETHOXAZOLE-TRIMETHOPRIM 800-160 MG PO TABS: 1.0000 | ORAL_TABLET | Freq: Two times a day (BID) | ORAL | 0 refills | Status: DC

## 2022-05-03 NOTE — Progress Notes (Signed)
Patient ID: Tanya Warren, female    DOB: May 07, 1970, 52 y.o.   MRN: [id number]  Chief Complaint  Patient presents with   Urinary Frequency    Pt co urinary frequency, Lower back pain,dysuria and blood in urine. Has tried UTI OTC. Present for 1 day.     HPI:      UTI sx:   Pt co urinary frequency, Lower back pain,dysuria and blood in urine. Has tried UTI OTC. Present for 1 day. reports having a few droplets of blood in her urine, took OTC urinary tabs w/Methenamine and feels a lot better, but wanted to go ahead and get checked.   Assessment & Plan:  1. Cystitis advised ok to continue taking the OTC urinary med she has been since it is working, but sending Bactrim just in case her sx returns. Advised on use & SE. Continue to drink 2L water qd.  - POCT Urinalysis Dipstick - sulfamethoxazole-trimethoprim (BACTRIM DS) 800-160 MG tablet; Take 1 tablet by mouth 2 (two) times daily after a meal. DO NOT TAKE with the OTC Urinary tablets (Methenamine)  Dispense: 6 tablet; Refill: 0  Subjective:    No outpatient medications prior to visit.   No facility-administered medications prior to visit.   Past Medical History:  Diagnosis Date   Headache    History of chicken pox    Papilloma of left breast    Past Surgical History:  Procedure Laterality Date   BREAST BIOPSY Left 03/06/2022   MM LT BREAST BX W LOC DEV 1ST LESION IMAGE BX SPEC STEREO GUIDE 03/06/2022 [facility]   BREAST EXCISIONAL BIOPSY Left 08/2016   INTRADUCTAL PAPILLOMA   BREAST LUMPECTOMY WITH RADIOACTIVE SEED LOCALIZATION Left 08/30/2016   Procedure: LEFT BREAST LUMPECTOMY WITH RADIOACTIVE SEED LOCALIZATION;  Surgeon: Jovita Kussmaul, MD;  Location: [place];  Service: General;  Laterality: Left;   DILATION AND CURETTAGE OF UTERUS     done in GYN office   ROTATOR CUFF REPAIR     Allergies  Allergen Reactions   Cinnamon Anaphylaxis   Penicillins Anaphylaxis and Rash    Has patient had a PCN  reaction causing immediate rash, facial/tongue/throat swelling, SOB or lightheadedness with hypotension: Yes Has patient had a PCN reaction causing severe rash involving mucus membranes or skin necrosis: No Has patient had a PCN reaction that required hospitalization No Has patient had a PCN reaction occurring within the last 10 years: No If all of the above answers are "NO", then may proceed with Cephalosporin use.    Allantoin-Pramoxine    Neosporin [Neomycin-Bacitracin Zn-Polymyx] Swelling and Rash      Objective:    Physical Exam Vitals and nursing note reviewed.  Constitutional:      Appearance: Normal appearance.  Cardiovascular:     Rate and Rhythm: Normal rate and regular rhythm.  Pulmonary:     Effort: Pulmonary effort is normal.     Breath sounds: Normal breath sounds.  Musculoskeletal:        General: Normal range of motion.  Skin:    General: Skin is warm and dry.  Neurological:     Mental Status: She is alert.  Psychiatric:        Mood and Affect: Mood normal.        Behavior: Behavior normal.    BP (!) 159/87 (BP Location: Left Arm, Patient Position: Sitting, Cuff Size: Large)   Pulse (!) 107   Temp 98 F (36.7 C) (Temporal)   Ht  $'5\' 6"'U$  (1.676 m)   Wt 168 lb 3.2 oz (76.3 kg)   SpO2 100%   BMI 27.15 kg/m  Wt Readings from Last 3 Encounters:  05/03/22 168 lb 3.2 oz (76.3 kg)  04/11/22 173 lb (78.5 kg)  03/22/22 167 lb (75.8 kg)       Jeanie Sewer, NP

## 2022-06-21 NOTE — Telephone Encounter (Signed)
Patient is scheduled for 09/06/22 at 10am at [facility].

## 2022-09-06 ENCOUNTER — Ambulatory Visit: Payer: BC Managed Care – PPO

## 2022-09-06 ENCOUNTER — Ambulatory Visit
Admission: RE | Admit: 2022-09-06 | Discharge: 2022-09-06 | Disposition: A | Discharge status: Home / Self Care | Payer: BC Managed Care – PPO | Source: Ambulatory Visit | Attending: Obstetrics & Gynecology | Admitting: Obstetrics & Gynecology

## 2022-09-06 DIAGNOSIS — R921 Mammographic calcification found on diagnostic imaging of breast: Secondary | ICD-10-CM

## 2022-12-25 ENCOUNTER — Encounter: Payer: Self-pay | Admitting: Physician Assistant

## 2023-01-12 IMAGING — US US PELVIS COMPLETE TRANSABD/TRANSVAG W DUPLEX AND/OR DOPPLER
1 series · 15 of 25 positions shown · non-contrast
Comparison: Ultrasound dated 08/26/2017.

CLINICAL DATA: Pelvic pain.

EXAM:
TRANSABDOMINAL AND TRANSVAGINAL ULTRASOUND OF PELVIS
DOPPLER ULTRASOUND OF OVARIES
TECHNIQUE: Both transabdominal and transvaginal ultrasound examinations of the
pelvis were performed. Transabdominal technique was performed for
global imaging of the pelvis including uterus, ovaries, adnexal
regions, and pelvic cul-de-sac.
It was necessary to proceed with endovaginal exam following the
transabdominal exam to visualize the endometrium and ovaries. Color
and duplex Doppler ultrasound was utilized to evaluate blood flow to
the ovaries.

[Series 1: us transvaginal non-ob mc & wl · 15 of 63 slices shown]
[im 1/63]
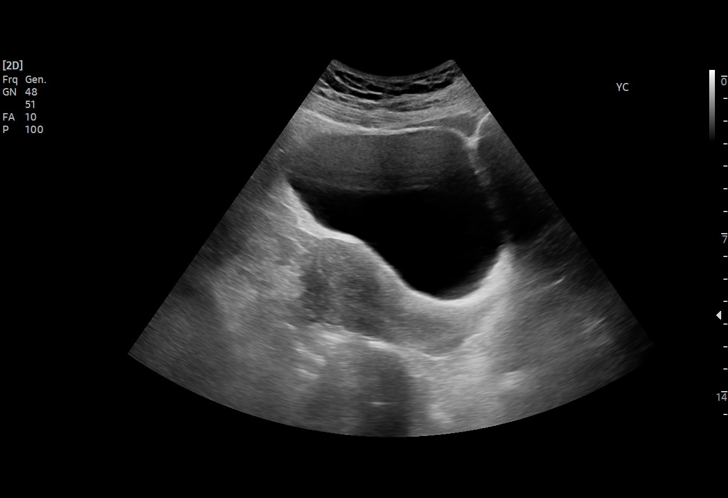
[im 6/63]
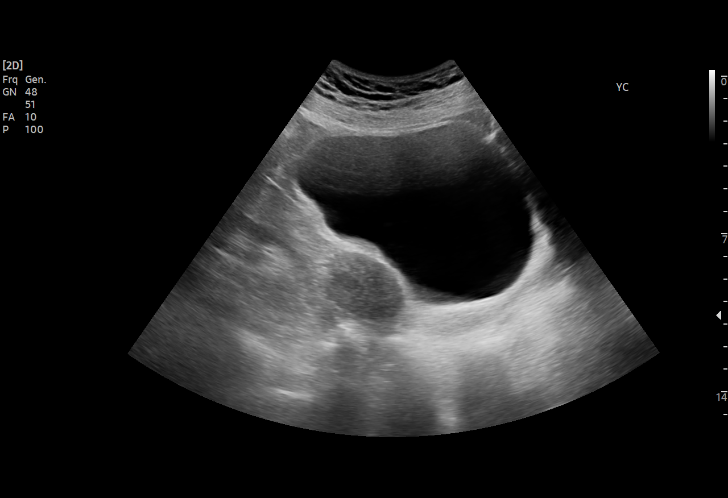
[im 11/63]
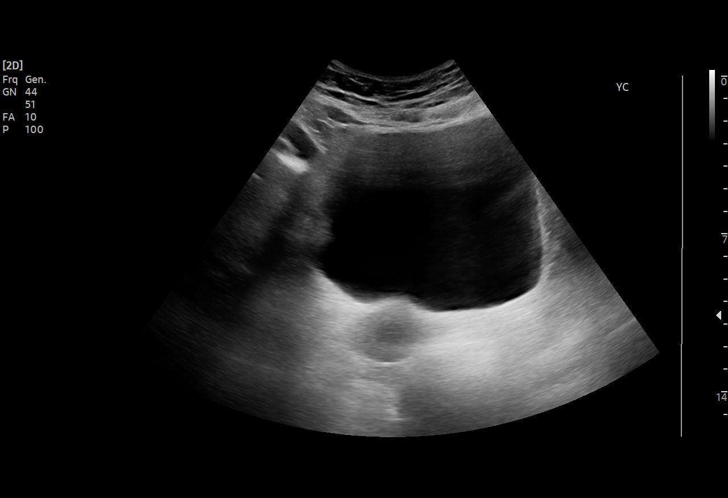
[im 13/63]
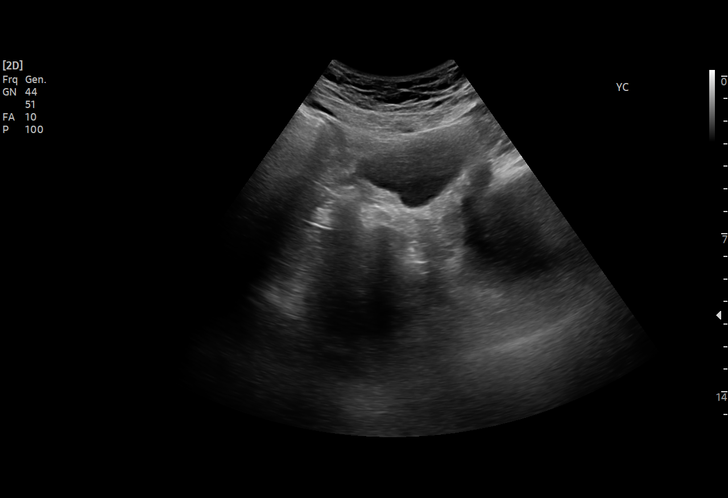
[im 19/63]
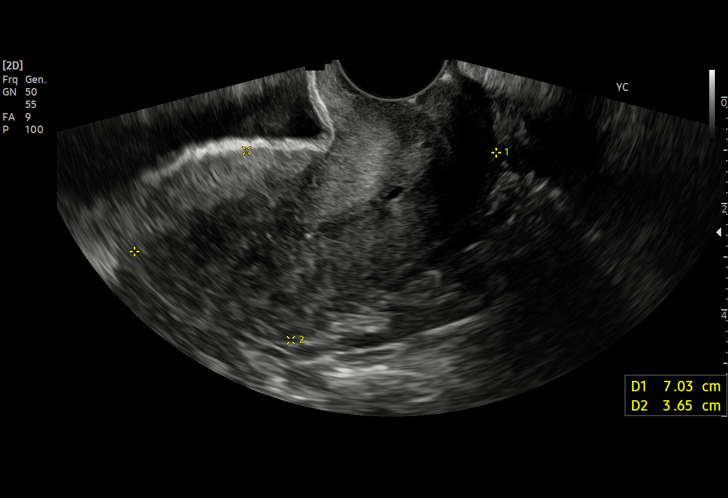
[im 24/63]
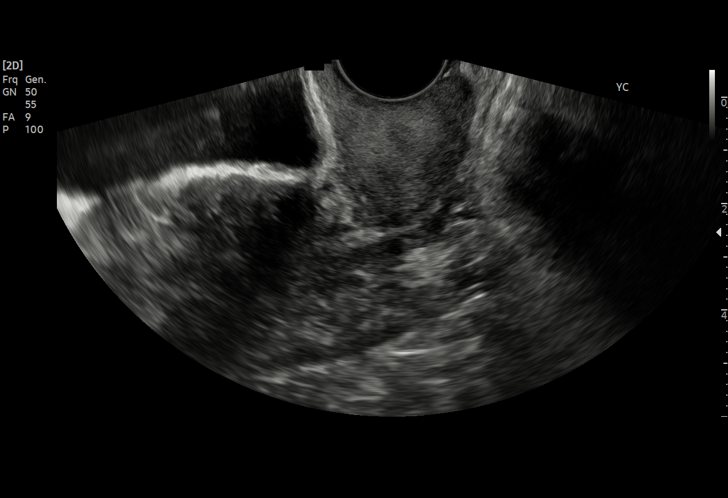
[im 26/63]
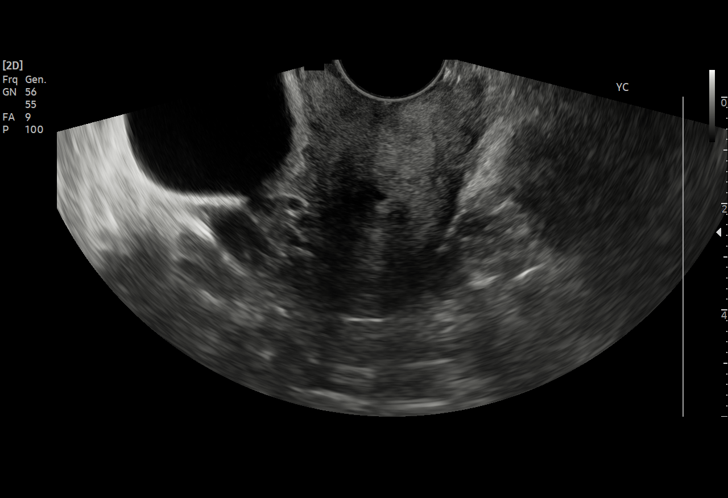
[im 32/63]
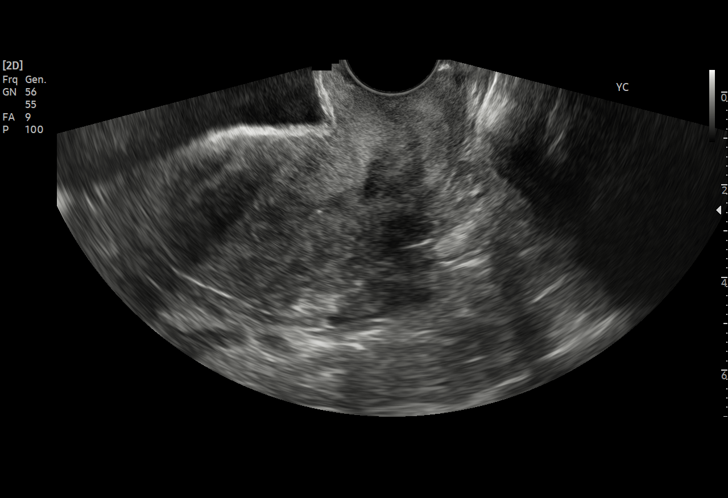
[im 37/63]
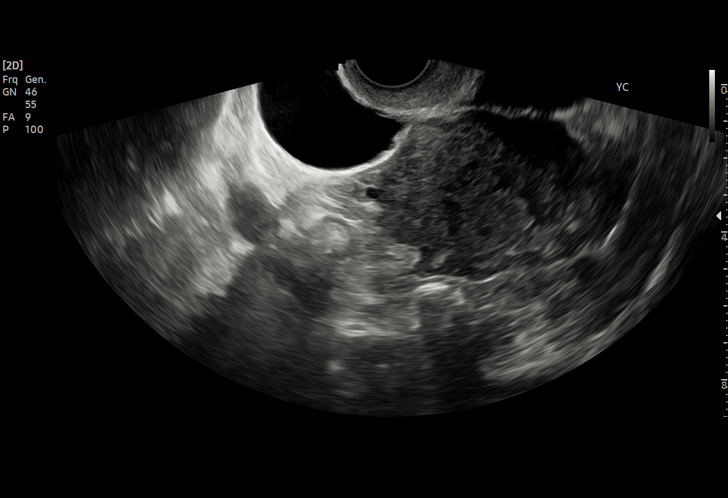
[im 39/63]
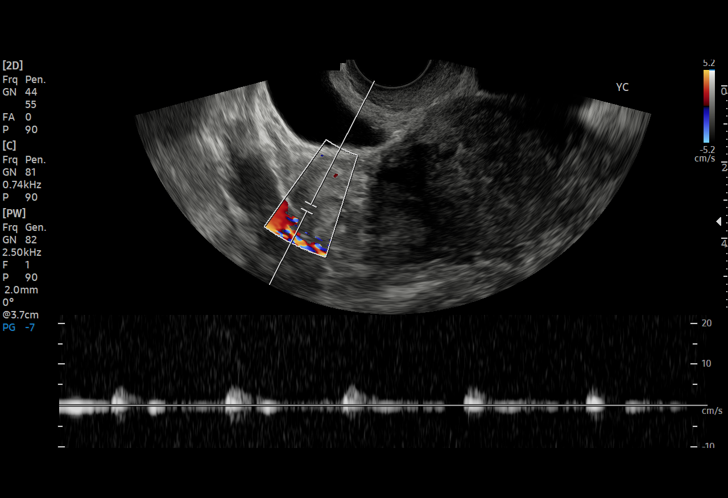
[im 44/63]
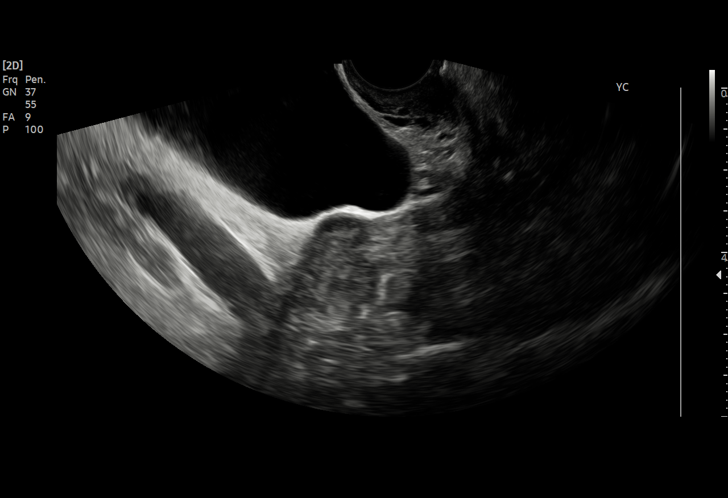
[im 50/63]
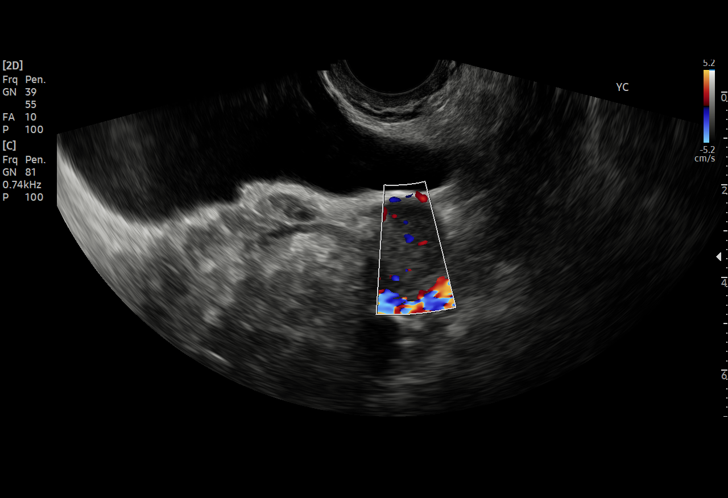
[im 52/63]
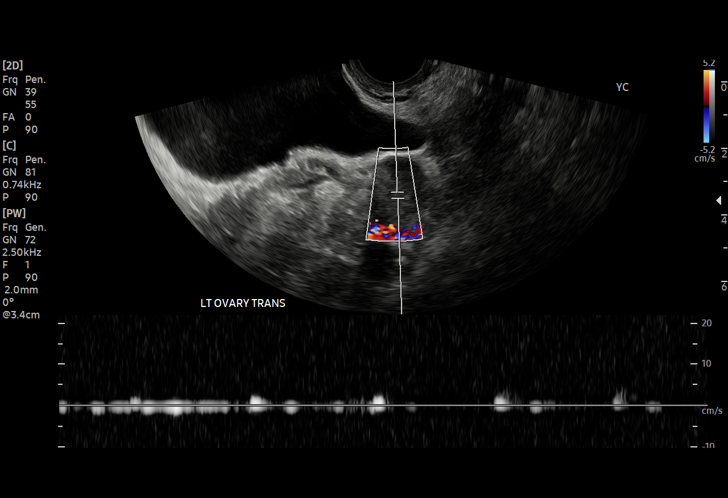
[im 57/63]
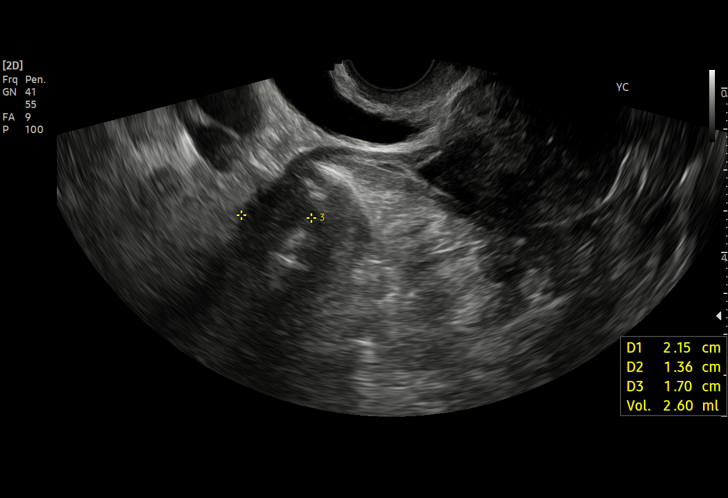
[im 63/63]
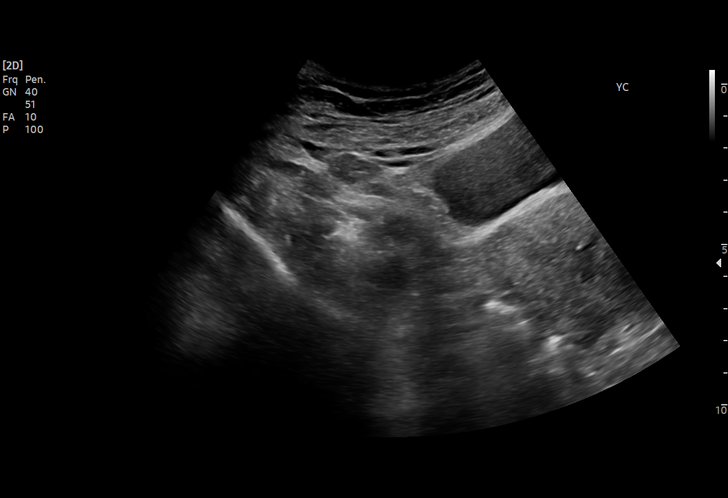

[15 of 25 positions shown; findings below may reference images not displayed]

FINDINGS: Uterus

Measurements: 7.0 x 3.7 x 5.0 cm = volume: 67 mL. The uterus is
anteverted and demonstrates a heterogeneous echotexture.

Endometrium

Thickness: 3 mm. The endometrium is poorly visualized and
suboptimally evaluated.

Right ovary

Measurements: 2.2 x 1.4 x 1.7 cm = volume: 2.6 mL. The ovary is
unremarkable as visualized.

Left ovary

Measurements: 1.8 x 1.6 x 1.4 cm = volume: 2.1 mL. Normal
appearance/no adnexal mass.

Pulsed Doppler evaluation of both ovaries demonstrates normal
low-resistance arterial and venous waveforms.

Other findings

No abnormal free fluid.
IMPRESSION: 1. Heterogeneous uterus.
2. Unremarkable ovaries.

## 2023-01-12 IMAGING — CT CT ABD-PELV W/ CM
2 of 5 series · 15 of 46 positions shown, 17 images · IV contrast (omnipaque)
Comparison: CT Abdomen and Pelvis 05/23/2015.

CLINICAL DATA: 50-year-old female with right lower quadrant pain
for 12 hours. History of symptomatic ovarian cyst.

EXAM:
CT ABDOMEN AND PELVIS WITH CONTRAST
TECHNIQUE: Multidetector CT imaging of the abdomen and pelvis was performed
using the standard protocol following bolus administration of
intravenous contrast.
CONTRAST:  80mL OMNIPAQUE IOHEXOL 350 MG/ML SOLN

[Series 2: axial st · axial · 0.76mm/px · z∈[-504,-90]mm · 12 of 97 slices shown, 14 images]
[im 7/97  soft-tissue]
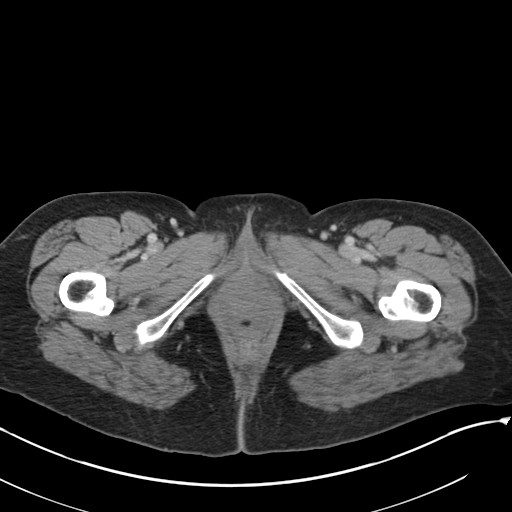
[im 7/97  bone]
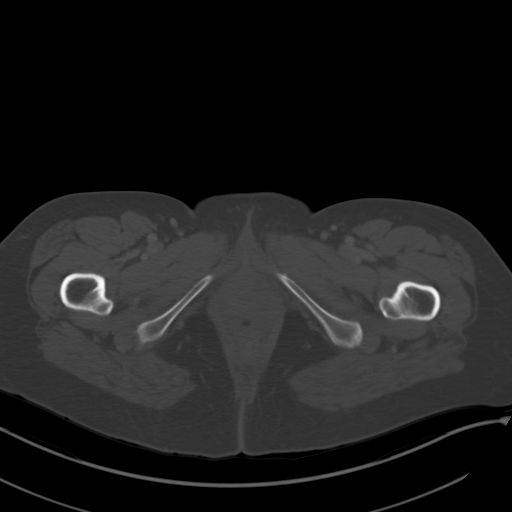
[im 13/97  soft-tissue]
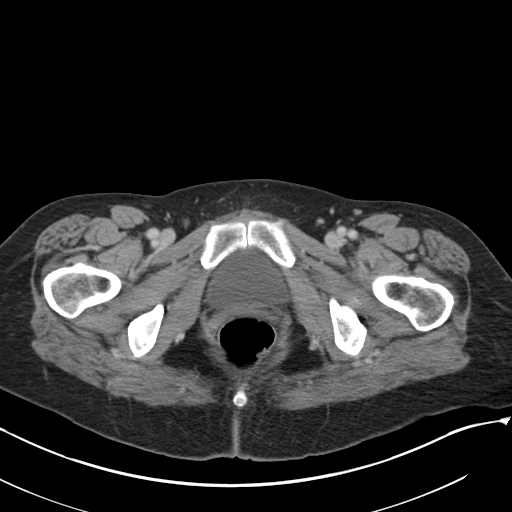
[im 20/97  soft-tissue]
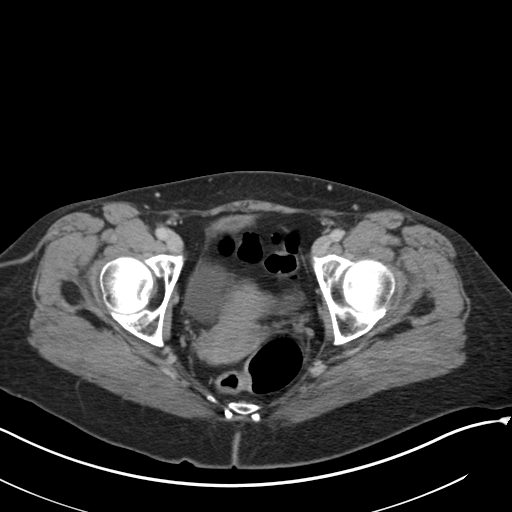
[im 33/97  soft-tissue]
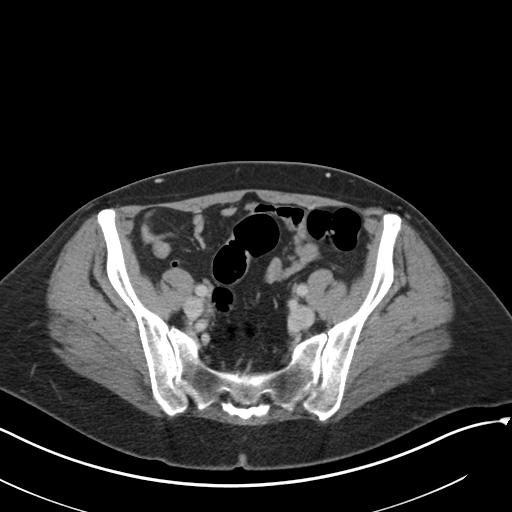
[im 39/97  soft-tissue]
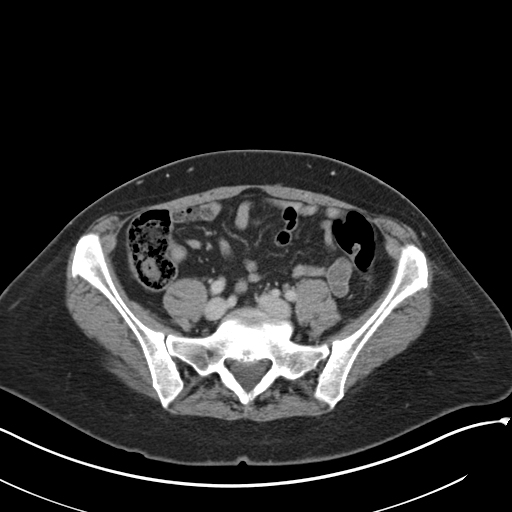
[im 45/97  soft-tissue]
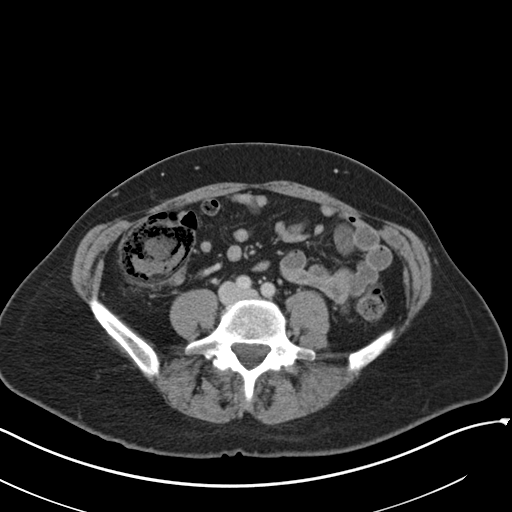
[im 52/97  soft-tissue]
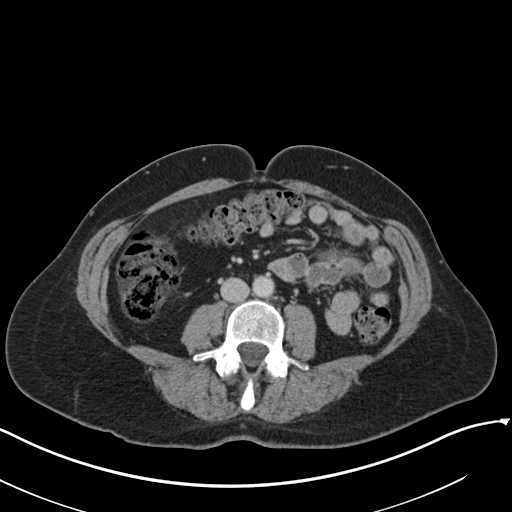
[im 58/97  soft-tissue]
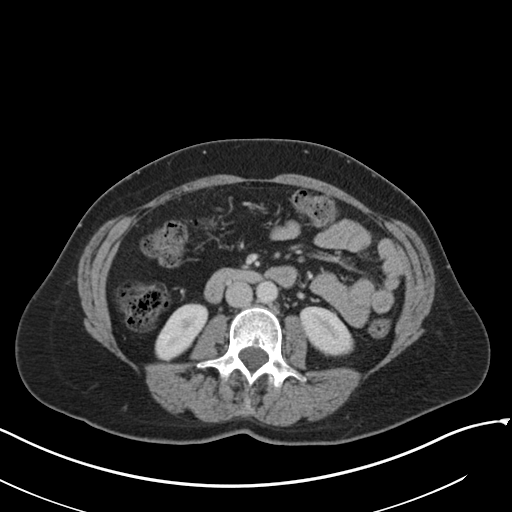
[im 65/97  soft-tissue]
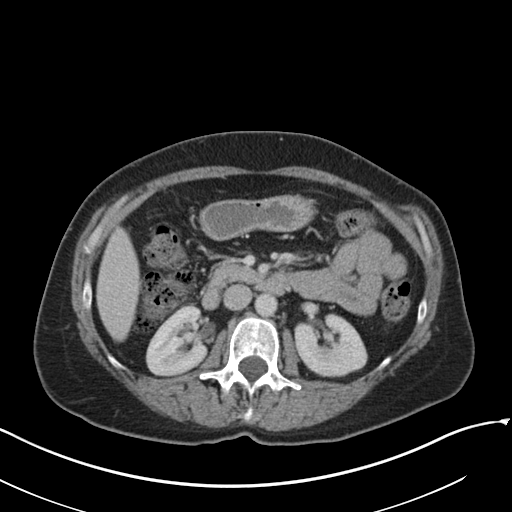
[im 65/97  bone]
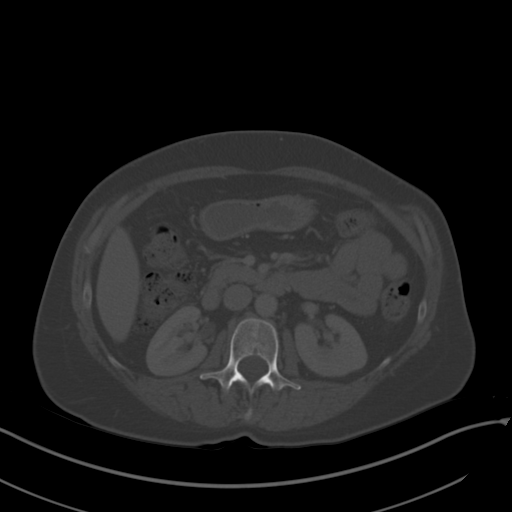
[im 77/97  soft-tissue]
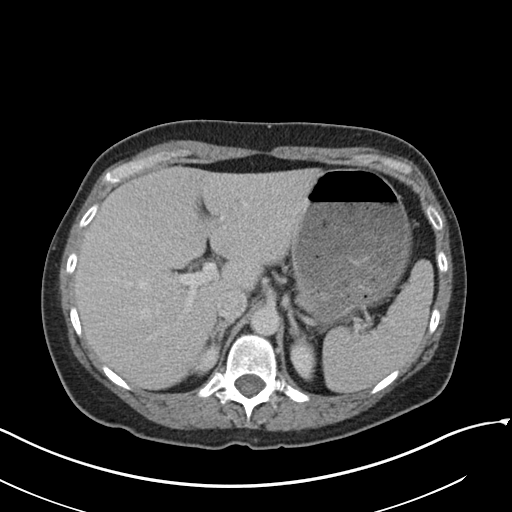
[im 84/97  soft-tissue]
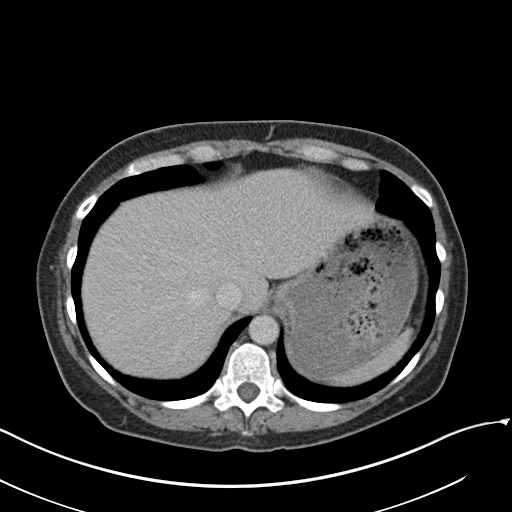
[im 90/97  soft-tissue]
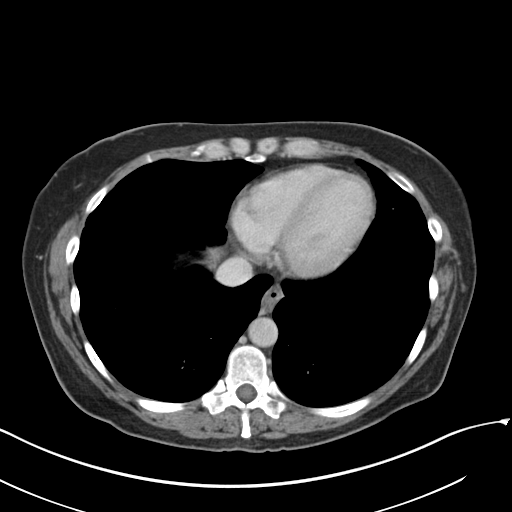

[Series 5: coronal st · coronal · 0.85mm/px · 3 of 150 slices shown]
[im 50/150  soft-tissue]
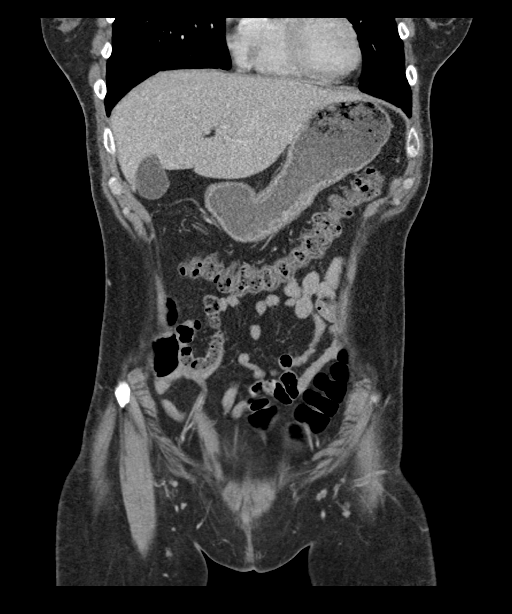
[im 67/150  soft-tissue]
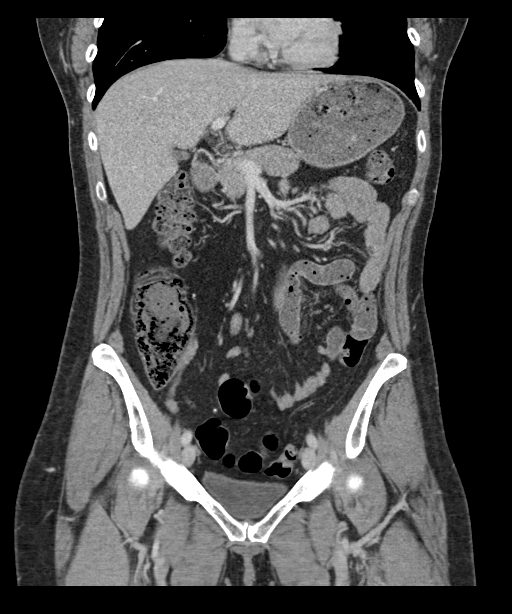
[im 83/150  soft-tissue]
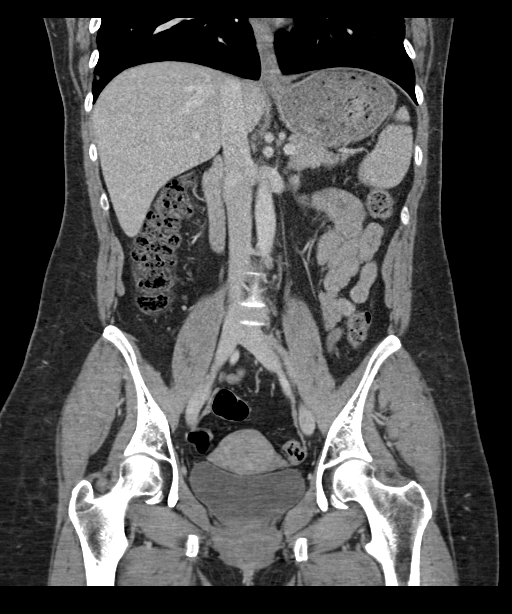

[15 of 46 positions shown; findings below may reference images not displayed]

FINDINGS: Lower chest: Since 5205 there are multiple new indistinct lung base
pulmonary nodules bilaterally. Bilateral visible middle and lower
lobes affected. The nodules appear solid with indistinct margins, up
to 5 mm and are peribronchial and subpleural. No pleural or
pericardial effusion.

Hepatobiliary: Negative liver and gallbladder. No bile duct
enlargement.

Pancreas: Negative.

Spleen: Negative.

Adrenals/Urinary Tract: Normal adrenal glands. Kidneys appears
stable and normal. No hydronephrosis, nephrolithiasis, or pararenal
inflammation. Symmetric renal contrast excretion on the delayed
images. Diminutive ureters. Unremarkable bladder.

Stomach/Bowel: Redundant large bowel in the pelvis mildly distended
with gas throughout. Upstream retained stool in the large bowel
beginning with the descending colon. Heterogeneous appearing stool
in the cecum, but normal appendix (coronal image 70) and no large
bowel inflammation. Terminal ileum is decompressed and negative. No
dilated small bowel. Stomach is moderately distended with fluid and
food. Duodenum is decompressed and negative.

No free air, free fluid, or mesenteric inflammatory stranding
identified.

Vascular/Lymphatic: Major arterial structures throughout the abdomen
and pelvis appear patent and normal. Portal venous system is patent.
Central venous structures in the abdomen and pelvis also appear
patent. No lymphadenopathy identified.

Reproductive: Negative. The right ovary appears diminutive on series
2, image 67.

Other: No pelvic free fluid.

Musculoskeletal: Stable visualized osseous structures. No
significant osseous abnormality.
IMPRESSION: 1. Normal appendix. With no acute or inflammatory process identified
in the abdomen or pelvis.

2. However, there are numerous small new pulmonary nodules (up to 5
mm) scattered in the visible bilateral middle and lower lobes. They
have indistinct margins favoring a pulmonary inflammatory process.
Consider viral/atypical respiratory infection and dedicated chest
imaging may be valuable. No pleural effusion.
No routine follow-up imaging is recommended per [HOSPITAL]
Guidelines.
These guidelines do not apply to immunocompromised patients and
patients with cancer. For lung cancer screening, adhere to Lung-RADS
guidelines. Reference: Radiology. 5205; 284(1):228-43.

## 2023-03-04 ENCOUNTER — Other Ambulatory Visit: Payer: Self-pay | Admitting: Physician Assistant

## 2023-03-04 DIAGNOSIS — Z1231 Encounter for screening mammogram for malignant neoplasm of breast: Secondary | ICD-10-CM

## 2023-05-02 ENCOUNTER — Encounter: Payer: Self-pay | Admitting: Obstetrics and Gynecology

## 2023-05-02 ENCOUNTER — Other Ambulatory Visit (HOSPITAL_COMMUNITY)
Admission: RE | Admit: 2023-05-02 | Discharge: 2023-05-02 | Disposition: A | Discharge status: Home / Self Care | Payer: 59 | Source: Ambulatory Visit | Attending: Obstetrics and Gynecology | Admitting: Obstetrics and Gynecology

## 2023-05-02 ENCOUNTER — Ambulatory Visit: Payer: 59 | Admitting: Obstetrics and Gynecology

## 2023-05-02 VITALS — BP 122/78 | HR 74 | Resp 16 | Ht 66.5 in | Wt 178.0 lb

## 2023-05-02 DIAGNOSIS — Z01419 Encounter for gynecological examination (general) (routine) without abnormal findings: Secondary | ICD-10-CM | POA: Insufficient documentation

## 2023-05-02 DIAGNOSIS — Z1331 Encounter for screening for depression: Secondary | ICD-10-CM

## 2023-05-02 DIAGNOSIS — E2839 Other primary ovarian failure: Secondary | ICD-10-CM | POA: Diagnosis not present

## 2023-05-02 NOTE — Progress Notes (Signed)
53 y.o. y.o. female here for annual exam. No LMP recorded. (Menstrual status: Perimenopausal). Period Duration (Days): 3 Period Pattern: (!) Irregular Menstrual Control: Tampon Dysmenorrhea: (!) Mild Dysmenorrhea Symptoms: Cramping One 1-2 periods a year.   [id number]  Same sex relationship, married. 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   RP:  Established patient presenting for annual gyn exam    HPI: Perimenopause with a light LMP in 06/2021.  .  Very upbeat with no hot flushes or night sweat.  No pelvic pain.  Pap Neg 11/2020.  Repeat Pap at 3 years.  Urine and bowel movements normal. Colono 07/2020.  Breasts normal. Mammo 02/2022.  Lt Dx Mammo/US suspicious.  Lt breast Bx benign. Caused much anxiety and pain with biopsy. .  Body mass index stable at 27.71.  Feeling great on a low carb diet.  Walking 3-5 miles daily.   Health labs with [facility]. Body mass index is 28.3 kg/m.     05/02/2023    9:03 AM 03/22/2022    1:13 PM 12/03/2018    1:10 PM  Depression screen PHQ 2/9  Decreased Interest 0 0 0  Down, Depressed, Hopeless 0 0 0  PHQ - 2 Score 0 0 0    Blood pressure 122/78, pulse 74, resp. rate 16, height 5' 6.5" (1.689 m), weight 178 lb (80.7 kg).     Component Value Date/Time   DIAGPAP  12/01/2020 1550    - Negative for intraepithelial lesion or malignancy (NILM)   ADEQPAP  12/01/2020 1550    Satisfactory for evaluation; transformation zone component PRESENT.    GYN HISTORY:    Component Value Date/Time   DIAGPAP  12/01/2020 1550    - Negative for intraepithelial lesion or malignancy (NILM)   ADEQPAP  12/01/2020 1550    Satisfactory for evaluation; transformation zone component PRESENT.    OB History  Gravida Para Term Preterm AB Living  4 2 2  2 2   SAB IAB Ectopic Multiple Live Births  2        # Outcome Date GA Lbr Len/2nd Weight Sex Type Anes PTL Lv  4 SAB           3 SAB           2 Term           1 Term             Past Medical  History:  Diagnosis Date   Headache    History of chicken pox    Papilloma of left breast     Past Surgical History:  Procedure Laterality Date   BREAST BIOPSY Left 03/06/2022   MM LT BREAST BX W LOC DEV 1ST LESION IMAGE BX SPEC STEREO GUIDE 03/06/2022 [facility]   BREAST EXCISIONAL BIOPSY Left 08/2016   INTRADUCTAL PAPILLOMA   BREAST LUMPECTOMY WITH RADIOACTIVE SEED LOCALIZATION Left 08/30/2016   Procedure: LEFT BREAST LUMPECTOMY WITH RADIOACTIVE SEED LOCALIZATION;  Surgeon: Griselda Miner, MD;  Location: [facility];  Service: General;  Laterality: Left;   DILATION AND CURETTAGE OF UTERUS     done in GYN office   ROTATOR CUFF REPAIR      No current outpatient medications on file prior to visit.   No current facility-administered medications on file prior to visit.    Social History   Socioeconomic History   Marital status: Married    Spouse name: Not on file   Number of children:  Not on file   Years of education: Not on file   Highest education level: Not on file  Occupational History   Not on file  Tobacco Use   Smoking status: Never   Smokeless tobacco: Never  Vaping Use   Vaping status: Never Used  Substance and Sexual Activity   Alcohol use: Yes    Comment: social   Drug use: No   Sexual activity: Yes    Partners: Female    Birth control/protection: None    Comment: First sexual encounter at 53 yrs old. Less than 5 partners  Other Topics Concern   Not on file  Social History Narrative   Not on file   Social Drivers of Health   Financial Resource Strain: Not on file  Food Insecurity: Not on file  Transportation Needs: Not on file  Physical Activity: Not on file  Stress: Not on file  Social Connections: Not on file  Intimate Partner Violence: Not on file    Family History  Problem Relation Age of Onset   Alcohol abuse Mother    Mental illness Mother    Colon cancer Father    Diabetes Father    Heart attack Father    Heart  disease Father    Mental illness Father    Alcohol abuse Sister    Mental illness Sister      Allergies  Allergen Reactions   Cinnamon Anaphylaxis   Penicillins Anaphylaxis and Rash    Has patient had a PCN reaction causing immediate rash, facial/tongue/throat swelling, SOB or lightheadedness with hypotension: Yes Has patient had a PCN reaction causing severe rash involving mucus membranes or skin necrosis: No Has patient had a PCN reaction that required hospitalization No Has patient had a PCN reaction occurring within the last 10 years: No If all of the above answers are "NO", then may proceed with Cephalosporin use.    Allantoin-Pramoxine    Neosporin [Neomycin-Bacitracin Zn-Polymyx] Swelling and Rash      Patient's last menstrual period was No LMP recorded. (Menstrual status: Perimenopausal)..            Review of Systems Alls systems reviewed and are negative.     Physical Exam Constitutional:      Appearance: Normal appearance.  Genitourinary:     Vulva and urethral meatus normal.     No lesions in the vagina.     Right Labia: No rash, lesions or skin changes.    Left Labia: No lesions, skin changes or rash.    No vaginal discharge or tenderness.     No vaginal prolapse present.    No vaginal atrophy present.     Right Adnexa: not tender, not palpable and no mass present.    Left Adnexa: not tender, not palpable and no mass present.    No cervical motion tenderness or discharge.     Uterus is not enlarged, tender or irregular.  Breasts:    Right: Normal.     Left: Normal.  HENT:     Head: Normocephalic.  Neck:     Thyroid: No thyroid mass, thyromegaly or thyroid tenderness.  Cardiovascular:     Rate and Rhythm: Normal rate and regular rhythm.     Heart sounds: Normal heart sounds, S1 normal and S2 normal.  Pulmonary:     Effort: Pulmonary effort is normal.     Breath sounds: Normal breath sounds and air entry.  Abdominal:     General: There is no  distension.  Palpations: Abdomen is soft. There is no mass.     Tenderness: There is no abdominal tenderness. There is no guarding or rebound.  Musculoskeletal:        General: Normal range of motion.     Cervical back: Full passive range of motion without pain, normal range of motion and neck supple. No tenderness.     Right lower leg: No edema.     Left lower leg: No edema.  Neurological:     Mental Status: She is alert.  Skin:    General: Skin is warm.  Psychiatric:        Mood and Affect: Mood normal.        Behavior: Behavior normal.        Thought Content: Thought content normal.  Vitals and nursing note reviewed. Exam conducted with a chaperone present.       A:         Well Woman GYN exam                             P:        Pap smear collected today Encouraged annual mammogram screening Colon cancer screening up-to-date DXA not indicated Labs and immunizations to do with PMD Discussed breast self exams Encouraged healthy lifestyle practices Encouraged Vit D and Calcium   No follow-ups on file.  Earley Favor

## 2023-05-06 ENCOUNTER — Ambulatory Visit
Admission: RE | Admit: 2023-05-06 | Discharge: 2023-05-06 | Disposition: A | Discharge status: Home / Self Care | Payer: 59 | Source: Ambulatory Visit | Attending: Physician Assistant

## 2023-05-06 DIAGNOSIS — Z1231 Encounter for screening mammogram for malignant neoplasm of breast: Secondary | ICD-10-CM

## 2023-05-07 ENCOUNTER — Encounter: Payer: Self-pay | Admitting: Obstetrics and Gynecology

## 2023-05-07 LAB — CYTOLOGY - PAP
Comment: NEGATIVE
Diagnosis: NEGATIVE
High risk HPV: NEGATIVE

## 2023-06-22 ENCOUNTER — Encounter: Payer: Self-pay | Admitting: Physician Assistant

## 2023-06-24 ENCOUNTER — Other Ambulatory Visit: Payer: Self-pay

## 2023-06-24 NOTE — Telephone Encounter (Signed)
 Please see pt msg and response to pt msg as FYI

## 2023-07-11 ENCOUNTER — Ambulatory Visit: Admitting: Physician Assistant

## 2023-07-18 ENCOUNTER — Encounter: Payer: Self-pay | Admitting: Physician Assistant

## 2023-07-18 ENCOUNTER — Encounter (INDEPENDENT_AMBULATORY_CARE_PROVIDER_SITE_OTHER): Payer: Self-pay

## 2023-07-18 ENCOUNTER — Ambulatory Visit: Admitting: Physician Assistant

## 2023-07-18 VITALS — BP 148/86 | HR 80 | Temp 97.5°F | Ht 66.5 in | Wt 175.6 lb

## 2023-07-18 DIAGNOSIS — Z800 Family history of malignant neoplasm of digestive organs: Secondary | ICD-10-CM

## 2023-07-18 DIAGNOSIS — Z8249 Family history of ischemic heart disease and other diseases of the circulatory system: Secondary | ICD-10-CM | POA: Diagnosis not present

## 2023-07-18 DIAGNOSIS — Z0001 Encounter for general adult medical examination with abnormal findings: Secondary | ICD-10-CM | POA: Diagnosis not present

## 2023-07-18 DIAGNOSIS — Z131 Encounter for screening for diabetes mellitus: Secondary | ICD-10-CM | POA: Diagnosis not present

## 2023-07-18 DIAGNOSIS — N951 Menopausal and female climacteric states: Secondary | ICD-10-CM | POA: Diagnosis not present

## 2023-07-18 DIAGNOSIS — Z23 Encounter for immunization: Secondary | ICD-10-CM

## 2023-07-18 DIAGNOSIS — Z7689 Persons encountering health services in other specified circumstances: Secondary | ICD-10-CM

## 2023-07-18 DIAGNOSIS — Z0000 Encounter for general adult medical examination without abnormal findings: Secondary | ICD-10-CM

## 2023-07-18 DIAGNOSIS — Z13220 Encounter for screening for lipoid disorders: Secondary | ICD-10-CM | POA: Diagnosis not present

## 2023-07-18 DIAGNOSIS — R030 Elevated blood-pressure reading, without diagnosis of hypertension: Secondary | ICD-10-CM

## 2023-07-18 LAB — COMPREHENSIVE METABOLIC PANEL WITH GFR
ALT: 36 U/L — ABNORMAL HIGH (ref 0–35)
AST: 31 U/L (ref 0–37)
Albumin: 5 g/dL (ref 3.5–5.2)
Alkaline Phosphatase: 63 U/L (ref 39–117)
BUN: 15 mg/dL (ref 6–23)
CO2: 26 meq/L (ref 19–32)
Calcium: 9.7 mg/dL (ref 8.4–10.5)
Chloride: 102 meq/L (ref 96–112)
Creatinine, Ser: 0.86 mg/dL (ref 0.40–1.20)
GFR: 76.56 mL/min (ref >60.00)
Glucose, Bld: 101 mg/dL — ABNORMAL HIGH (ref 70–99)
Potassium: 4.1 meq/L (ref 3.5–5.1)
Sodium: 138 meq/L (ref 135–145)
Total Bilirubin: 0.6 mg/dL (ref 0.2–1.2)
Total Protein: 8.1 g/dL (ref 6.0–8.3)

## 2023-07-18 LAB — CBC WITH DIFFERENTIAL/PLATELET
Basophils Absolute: 0 10*3/uL (ref 0.0–0.1)
Basophils Relative: 1.2 % (ref 0.0–3.0)
Eosinophils Absolute: 0 10*3/uL (ref 0.0–0.7)
Eosinophils Relative: 1 % (ref 0.0–5.0)
HCT: 44.9 % (ref 36.0–46.0)
Hemoglobin: 14.9 g/dL (ref 12.0–15.0)
Lymphocytes Relative: 42.4 % (ref 12.0–46.0)
Lymphs Abs: 1.8 10*3/uL (ref 0.7–4.0)
MCHC: 33.3 g/dL (ref 30.0–36.0)
MCV: 93.7 fl (ref 78.0–100.0)
Monocytes Absolute: 0.4 10*3/uL (ref 0.1–1.0)
Monocytes Relative: 8.9 % (ref 3.0–12.0)
Neutro Abs: 2 10*3/uL (ref 1.4–7.7)
Neutrophils Relative %: 46.5 % (ref 43.0–77.0)
Platelets: 201 10*3/uL (ref 150.0–400.0)
RBC: 4.79 Mil/uL (ref 3.87–5.11)
RDW: 12.9 % (ref 11.5–15.5)
WBC: 4.2 10*3/uL (ref 4.0–10.5)

## 2023-07-18 LAB — LIPID PANEL
Cholesterol: 198 mg/dL (ref 0–200)
HDL: 64.6 mg/dL (ref >39.00)
LDL Cholesterol: 110 mg/dL — ABNORMAL HIGH (ref 0–99)
NonHDL: 133.01
Total CHOL/HDL Ratio: 3
Triglycerides: 114 mg/dL (ref 0.0–149.0)
VLDL: 22.8 mg/dL (ref 0.0–40.0)

## 2023-07-18 LAB — HEMOGLOBIN A1C: Hgb A1c MFr Bld: 5.5 % (ref 4.6–6.5)

## 2023-07-18 LAB — FSH/LH
FSH: 64.8 m[IU]/mL
LH: 29.9 m[IU]/mL

## 2023-07-18 LAB — TSH: TSH: 0.94 u[IU]/mL (ref 0.35–5.50)

## 2023-07-18 NOTE — Progress Notes (Signed)
 Patient ID: Tanya Warren, female    DOB: 1970-08-15, 53 y.o.   MRN: [id number]   Assessment & Plan:  Encounter for annual physical exam -     CBC with Differential/Platelet -     Comprehensive metabolic panel with GFR -     Hemoglobin A1c -     Lipid panel -     TSH -     EKG 12-Lead  Family history of heart disease -     EKG 12-Lead  White coat syndrome without hypertension  Immunization due -     Tdap vaccine greater than or equal to 7yo IM  Perimenopausal -     FSH/LH  Encounter for weight management -     Amb Ref to Medical Weight Management  Family history of colon cancer in father      Assessment and Plan Assessment & Plan Colorectal Cancer Screening Family history of stage four colon cancer in her father, diagnosed after diabetes-related complications. Colonoscopy in April 2022 revealed benign but precancerous tubular adenomas. Recommendation for repeat colonoscopy in seven years. No personal history of cancer or symptoms such as hematochezia or changes in bowel habits. Decision on timing to be confirmed after consulting with Dr. Barron Alvine. Informed pt I would message him today to confirm timing of next colonoscopy given updated family history.  Weight Management Actively managing weight through diet and exercise, including 1100 calories per day, 64 ounces of water, and attending fat burn boot camp 2-3 times a week. Lack of desired changes possibly due to insufficient caloric intake given activity level. Interested in exploring hormonal influences on weight and menopause status. Discussed potential for nutritional assessment and guidance through [facility] program to tailor dietary needs and explore medication options if appropriate. - Order hormonal panel including FSH to assess menopause status - Consider referral to [facility] program for nutritional assessment and guidance  Soy Intolerance Significant abdominal pain and  cramping with soy products, such as shakes and bars. Symptoms resolved after discontinuing soy, indicating likely intolerance. - Document soy intolerance in medical record  White Coat Syndrome Elevated blood pressure readings in clinical settings due to anxiety, with normal readings at home, consistent with white coat syndrome. - Document white coat syndrome in medical record  General Health Maintenance Up to date with mammograms and Pap smears. Dense breast tissue but no additional imaging required this year, possibly indicating progression through menopause. No recent tetanus vaccination noted. - Administer tetanus vaccination today   Diabetes Monitoring Blood work including A1c to monitor for diabetes given family history. Importance of regular screenings and staying informed about family health history to manage potential genetic risks discussed. - Perform blood work including A1c - Review and communicate lab results via MyChart - Follow up regarding colonoscopy timing after consulting with Dr. Barron Alvine  Patient Counseling: [x]   Nutrition: Stressed importance of moderation in sodium/caffeine intake, saturated fat and cholesterol, caloric balance, sufficient intake of fresh fruits, vegetables, and fiber.  [x]   Stressed the importance of regular exercise.   []   Substance Abuse: Discussed cessation/primary prevention of tobacco, alcohol, or other drug use; driving or other dangerous activities under the influence; availability of treatment for abuse.   []   Injury prevention: Discussed safety belts, safety helmets, smoke detector, smoking near bedding or upholstery.   []   Sexuality: Discussed sexually transmitted diseases, partner selection, use of condoms, avoidance of unintended pregnancy  and contraceptive alternatives.   [x]   Dental health: Discussed  importance of regular tooth brushing, flossing, and dental visits.  [x]   Health maintenance and immunizations reviewed. Please refer to  Health maintenance section.        No follow-ups on file.    Subjective:    Chief Complaint  Patient presents with   Annual Exam    Pt in office requesting CPE and fasting labs, also wanting to include EKG; pt states no issues but with family history of heart issues completes EKG at physical; pt also requesting labs to check hormone levels for menopause per GYN request.    HPI Discussed the use of AI scribe software for clinical note transcription with the patient, who gave verbal consent to proceed.  History of Present Illness Tanya Warren is a 53 year old female who presents for an annual physical exam.  She has a family history of significant medical issues, including her father with diabetes and stage four colon cancer. She underwent a colonoscopy in 2022, which revealed two 3-5 mm benign but precancerous tubular adenomas. She is scheduled for a repeat colonoscopy in seven years.  She experiences abdominal pain and cramping associated with the consumption of soy products. She identified soy as the cause after severe intestinal discomfort and frequent bathroom visits while on a diet plan that included soy-based shakes and bars. Her symptoms have resolved since eliminating soy from her diet. She does not consume sweets or sugar due to adverse reactions, including gastrointestinal discomfort.  She is actively working on her health by consuming two 240-calorie cachava shakes daily, maintaining a diet of approximately 1100 calories per day, and drinking 64 ounces of water daily. She attends a fat burn boot camp two to three times a week but is not seeing significant changes, which she attributes to her age and possibly not consuming enough calories.  She has not had a menstrual period since last year, with only two very light episodes occurring far apart, indicating she may be nearing menopause. She is interested in having her hormonal levels checked to assess her menopausal  status.  She has a history of white coat syndrome, with elevated blood pressure readings in medical settings but normal readings at home, typically around 127/80 to 130/90.  She has a history of dense breast tissue, which previously required additional imaging, but she did not require follow-up this past year, possibly indicating changes related to menopause. Her mother also has dense breast tissue but does not undergo regular screenings.     Past Medical History:  Diagnosis Date   Headache    History of chicken pox    Papilloma of left breast     Past Surgical History:  Procedure Laterality Date   BREAST BIOPSY Left 03/06/2022   MM LT BREAST BX W LOC DEV 1ST LESION IMAGE BX SPEC STEREO GUIDE 03/06/2022 [facility]   BREAST EXCISIONAL BIOPSY Left 08/2016   INTRADUCTAL PAPILLOMA   BREAST LUMPECTOMY WITH RADIOACTIVE SEED LOCALIZATION Left 08/30/2016   Procedure: LEFT BREAST LUMPECTOMY WITH RADIOACTIVE SEED LOCALIZATION;  Surgeon: Griselda Miner, MD;  Location: [facility];  Service: General;  Laterality: Left;   DILATION AND CURETTAGE OF UTERUS     done in GYN office   ROTATOR CUFF REPAIR      Family History  Problem Relation Age of Onset   Alcohol abuse Mother    Mental illness Mother    Colon cancer Father    Diabetes Father    Heart attack Father    Heart disease  Father    Mental illness Father    Alcohol abuse Sister    Mental illness Sister     Social History   Tobacco Use   Smoking status: Never   Smokeless tobacco: Never  Vaping Use   Vaping status: Never Used  Substance Use Topics   Alcohol use: Yes    Comment: social   Drug use: No     Allergies  Allergen Reactions   Cinnamon Anaphylaxis   Penicillins Anaphylaxis, Rash and Other (See Comments)    Has patient had a PCN reaction causing immediate rash, facial/tongue/throat swelling, SOB or lightheadedness with hypotension: Yes  Has patient had a PCN reaction causing severe rash  involving mucus membranes or skin necrosis: No  Has patient had a PCN reaction that required hospitalization No  Has patient had a PCN reaction occurring within the last 10 years: No  If all of the above answers are "NO", then may proceed with Cephalosporin use.   Soybean-Containing Drug Products Other (See Comments)    Abdominal pain, cramping    Neosporin [Neomycin-Bacitracin Zn-Polymyx] Swelling and Rash    Review of Systems NEGATIVE UNLESS OTHERWISE INDICATED IN HPI      Objective:     BP (!) 148/86 (BP Location: Left Arm, Patient Position: Sitting) Comment: per pt has white coat syndrome  Pulse 80   Temp (!) 97.5 F (36.4 C) (Temporal)   Ht 5' 6.5" (1.689 m)   Wt 175 lb 9.6 oz (79.7 kg)   SpO2 98%   BMI 27.92 kg/m   Wt Readings from Last 3 Encounters:  07/18/23 175 lb 9.6 oz (79.7 kg)  05/02/23 178 lb (80.7 kg)  05/03/22 168 lb 3.2 oz (76.3 kg)    BP Readings from Last 3 Encounters:  07/18/23 (!) 148/86  05/02/23 122/78  05/03/22 (!) 159/87     Physical Exam Vitals and nursing note reviewed.  Constitutional:      Appearance: Normal appearance. She is normal weight. She is not toxic-appearing.  HENT:     Head: Normocephalic and atraumatic.     Right Ear: Tympanic membrane, ear canal and external ear normal.     Left Ear: Tympanic membrane, ear canal and external ear normal.     Nose: Nose normal.     Mouth/Throat:     Mouth: Mucous membranes are moist.  Eyes:     Extraocular Movements: Extraocular movements intact.     Conjunctiva/sclera: Conjunctivae normal.     Pupils: Pupils are equal, round, and reactive to light.  Cardiovascular:     Rate and Rhythm: Normal rate and regular rhythm.     Pulses: Normal pulses.     Heart sounds: Normal heart sounds.  Pulmonary:     Effort: Pulmonary effort is normal.     Breath sounds: Normal breath sounds.  Abdominal:     General: Abdomen is flat. Bowel sounds are normal.     Palpations: Abdomen is soft.   Musculoskeletal:        General: Normal range of motion.     Cervical back: Normal range of motion and neck supple.  Skin:    General: Skin is warm and dry.     Findings: No lesion or rash.  Neurological:     General: No focal deficit present.     Mental Status: She is alert and oriented to person, place, and time.  Psychiatric:        Mood and Affect: Mood normal.  Behavior: Behavior normal.             Skyylar Kopf M Tage Feggins, PA-C

## 2023-07-19 ENCOUNTER — Encounter: Payer: Self-pay | Admitting: Physician Assistant

## 2023-07-19 NOTE — Telephone Encounter (Signed)
 Please see pt message as Tanya Warren and provide any recommendations if needed.

## 2023-08-08 ENCOUNTER — Ambulatory Visit (INDEPENDENT_AMBULATORY_CARE_PROVIDER_SITE_OTHER): Admitting: Physician Assistant

## 2023-08-08 ENCOUNTER — Encounter: Payer: Self-pay | Admitting: Physician Assistant

## 2023-08-08 VITALS — BP 152/80 | HR 88 | Temp 97.5°F | Ht 66.5 in | Wt 177.8 lb

## 2023-08-08 DIAGNOSIS — R519 Headache, unspecified: Secondary | ICD-10-CM | POA: Diagnosis not present

## 2023-08-08 DIAGNOSIS — R030 Elevated blood-pressure reading, without diagnosis of hypertension: Secondary | ICD-10-CM | POA: Diagnosis not present

## 2023-08-08 DIAGNOSIS — M62838 Other muscle spasm: Secondary | ICD-10-CM | POA: Diagnosis not present
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
## 2023-08-08 NOTE — Telephone Encounter (Signed)
 Please see pt request and advise

## 2023-08-12 NOTE — Telephone Encounter (Signed)
 Please see patient response to previous message

## 2023-08-13 ENCOUNTER — Encounter: Payer: Self-pay | Admitting: Physician Assistant

## 2024-05-06 ENCOUNTER — Other Ambulatory Visit: Payer: Self-pay | Admitting: Physician Assistant

## 2024-05-06 DIAGNOSIS — Z1231 Encounter for screening mammogram for malignant neoplasm of breast: Secondary | ICD-10-CM

## 2024-06-02 ENCOUNTER — Ambulatory Visit
